# Patient Record
Sex: Female | Born: 1970 | Race: Black or African American | Hispanic: No | Smoking: Never smoker
Health system: Southern US, Community
[De-identification: ages and names within clinical notes are randomized; demographics above are authoritative.]

## PROBLEM LIST (undated history)

## (undated) DIAGNOSIS — IMO0002 Reserved for concepts with insufficient information to code with codable children: Secondary | ICD-10-CM

## (undated) DIAGNOSIS — Z8614 Personal history of Methicillin resistant Staphylococcus aureus infection: Secondary | ICD-10-CM

## (undated) DIAGNOSIS — R1310 Dysphagia, unspecified: Secondary | ICD-10-CM

## (undated) DIAGNOSIS — Z803 Family history of malignant neoplasm of breast: Secondary | ICD-10-CM

## (undated) DIAGNOSIS — K219 Gastro-esophageal reflux disease without esophagitis: Secondary | ICD-10-CM

## (undated) DIAGNOSIS — Z8051 Family history of malignant neoplasm of kidney: Secondary | ICD-10-CM

## (undated) DIAGNOSIS — M1990 Unspecified osteoarthritis, unspecified site: Secondary | ICD-10-CM

## (undated) DIAGNOSIS — Z8041 Family history of malignant neoplasm of ovary: Secondary | ICD-10-CM

## (undated) DIAGNOSIS — D242 Benign neoplasm of left breast: Secondary | ICD-10-CM

## (undated) DIAGNOSIS — K589 Irritable bowel syndrome without diarrhea: Secondary | ICD-10-CM

## (undated) HISTORY — DX: Family history of malignant neoplasm of kidney: Z80.51

## (undated) HISTORY — PX: BUNIONECTOMY: SHX129

## (undated) HISTORY — DX: Reserved for concepts with insufficient information to code with codable children: IMO0002

## (undated) HISTORY — PX: TUBAL LIGATION: SHX77

## (undated) HISTORY — PX: TONSILLECTOMY AND ADENOIDECTOMY: SHX28

## (undated) HISTORY — DX: Family history of malignant neoplasm of ovary: Z80.41

## (undated) HISTORY — DX: Family history of malignant neoplasm of breast: Z80.3

---

## 1998-04-22 ENCOUNTER — Emergency Department (HOSPITAL_COMMUNITY): Admission: EM | Admit: 1998-04-22 | Discharge: 1998-04-22 | Payer: Self-pay | Admitting: Emergency Medicine

## 1999-04-02 ENCOUNTER — Emergency Department (HOSPITAL_COMMUNITY): Admission: EM | Admit: 1999-04-02 | Discharge: 1999-04-02 | Payer: Self-pay | Admitting: Internal Medicine

## 2002-11-04 ENCOUNTER — Emergency Department (HOSPITAL_COMMUNITY): Admission: EM | Admit: 2002-11-04 | Discharge: 2002-11-04 | Payer: Self-pay

## 2003-03-10 ENCOUNTER — Emergency Department (HOSPITAL_COMMUNITY): Admission: EM | Admit: 2003-03-10 | Discharge: 2003-03-10 | Payer: Self-pay | Admitting: Emergency Medicine

## 2003-03-15 ENCOUNTER — Emergency Department (HOSPITAL_COMMUNITY): Admission: EM | Admit: 2003-03-15 | Discharge: 2003-03-16 | Payer: Self-pay | Admitting: Emergency Medicine

## 2004-06-23 ENCOUNTER — Ambulatory Visit (HOSPITAL_COMMUNITY): Admission: RE | Admit: 2004-06-23 | Discharge: 2004-06-23 | Payer: Self-pay | Admitting: Obstetrics

## 2004-07-06 ENCOUNTER — Encounter (INDEPENDENT_AMBULATORY_CARE_PROVIDER_SITE_OTHER): Payer: Self-pay | Admitting: *Deleted

## 2004-07-06 ENCOUNTER — Ambulatory Visit (HOSPITAL_COMMUNITY): Admission: RE | Admit: 2004-07-06 | Discharge: 2004-07-06 | Payer: Self-pay | Admitting: Obstetrics

## 2004-07-06 HISTORY — PX: HYSTEROSCOPY WITH NOVASURE: SHX5574

## 2004-07-06 HISTORY — PX: DILATION AND CURETTAGE OF UTERUS: SHX78

## 2005-06-25 ENCOUNTER — Encounter: Admission: RE | Admit: 2005-06-25 | Discharge: 2005-06-25 | Payer: Self-pay | Admitting: Neurology

## 2005-06-29 ENCOUNTER — Emergency Department (HOSPITAL_COMMUNITY): Admission: EM | Admit: 2005-06-29 | Discharge: 2005-06-29 | Payer: Self-pay | Admitting: Emergency Medicine

## 2007-08-15 ENCOUNTER — Inpatient Hospital Stay (HOSPITAL_COMMUNITY): Admission: AD | Admit: 2007-08-15 | Discharge: 2007-08-15 | Payer: Self-pay | Admitting: Obstetrics

## 2007-08-26 ENCOUNTER — Encounter: Admission: RE | Admit: 2007-08-26 | Discharge: 2007-08-26 | Payer: Self-pay | Admitting: Obstetrics

## 2007-08-26 ENCOUNTER — Encounter (INDEPENDENT_AMBULATORY_CARE_PROVIDER_SITE_OTHER): Payer: Self-pay | Admitting: Diagnostic Radiology

## 2008-07-29 ENCOUNTER — Encounter (INDEPENDENT_AMBULATORY_CARE_PROVIDER_SITE_OTHER): Payer: Self-pay | Admitting: Diagnostic Radiology

## 2008-07-29 ENCOUNTER — Encounter: Admission: RE | Admit: 2008-07-29 | Discharge: 2008-07-29 | Payer: Self-pay | Admitting: Infectious Diseases

## 2008-09-30 ENCOUNTER — Ambulatory Visit: Payer: Self-pay | Admitting: Obstetrics & Gynecology

## 2008-10-01 ENCOUNTER — Encounter: Payer: Self-pay | Admitting: Obstetrics & Gynecology

## 2008-10-01 LAB — CONVERTED CEMR LAB: Trich, Wet Prep: NONE SEEN

## 2008-11-22 ENCOUNTER — Encounter: Payer: Self-pay | Admitting: Obstetrics & Gynecology

## 2008-11-22 ENCOUNTER — Ambulatory Visit: Payer: Self-pay | Admitting: Obstetrics & Gynecology

## 2008-11-22 ENCOUNTER — Ambulatory Visit (HOSPITAL_COMMUNITY): Admission: RE | Admit: 2008-11-22 | Discharge: 2008-11-22 | Payer: Self-pay | Admitting: Obstetrics & Gynecology

## 2008-11-22 HISTORY — PX: DIAGNOSTIC LAPAROSCOPY: SUR761

## 2009-06-03 ENCOUNTER — Encounter: Admission: RE | Admit: 2009-06-03 | Discharge: 2009-06-03 | Payer: Self-pay | Admitting: Obstetrics

## 2009-06-03 ENCOUNTER — Ambulatory Visit (HOSPITAL_COMMUNITY): Admission: RE | Admit: 2009-06-03 | Discharge: 2009-06-03 | Payer: Self-pay | Admitting: Obstetrics

## 2009-11-07 ENCOUNTER — Encounter: Admission: RE | Admit: 2009-11-07 | Discharge: 2009-11-07 | Payer: Self-pay | Admitting: General Surgery

## 2010-01-24 ENCOUNTER — Encounter: Admission: RE | Admit: 2010-01-24 | Discharge: 2010-01-24 | Payer: Self-pay | Admitting: General Surgery

## 2010-09-10 ENCOUNTER — Encounter: Payer: Self-pay | Admitting: Obstetrics

## 2010-09-11 ENCOUNTER — Encounter: Payer: Self-pay | Admitting: Obstetrics

## 2010-10-06 ENCOUNTER — Other Ambulatory Visit: Payer: Self-pay | Admitting: Obstetrics

## 2010-10-06 DIAGNOSIS — Z1231 Encounter for screening mammogram for malignant neoplasm of breast: Secondary | ICD-10-CM

## 2010-11-09 ENCOUNTER — Ambulatory Visit
Admission: RE | Admit: 2010-11-09 | Discharge: 2010-11-09 | Disposition: A | Discharge status: Home / Self Care | Payer: No Typology Code available for payment source | Source: Ambulatory Visit | Attending: Obstetrics | Admitting: Obstetrics

## 2010-11-09 DIAGNOSIS — Z1231 Encounter for screening mammogram for malignant neoplasm of breast: Secondary | ICD-10-CM

## 2010-11-29 LAB — CBC
HCT: 41.2 % (ref 36.0–46.0)
Hemoglobin: 13.6 g/dL (ref 12.0–15.0)
MCHC: 33.1 g/dL (ref 30.0–36.0)
MCV: 86.2 fL (ref 78.0–100.0)
Platelets: 259 10*3/uL (ref 150–400)
RBC: 4.78 MIL/uL (ref 3.87–5.11)
RDW: 13.3 % (ref 11.5–15.5)
WBC: 4.3 10*3/uL (ref 4.0–10.5)

## 2010-11-29 LAB — PREGNANCY, URINE: Preg Test, Ur: NEGATIVE

## 2011-01-02 NOTE — Op Note (Signed)
NAME:  Wendy [name], Wendy [name]               ACCOUNT NO.:  [id number]   MEDICAL RECORD NO.:  [id number]          PATIENT TYPE:  AMB   LOCATION:  SDC                           FACILITY:  [facility]   PHYSICIAN:  Allie Bossier, MD        DATE OF BIRTH:  1971/03/15   DATE OF PROCEDURE:  DATE OF DISCHARGE:                               OPERATIVE REPORT   PREOPERATIVE DIAGNOSIS:  Chronic pelvic pain.   POSTOPERATIVE DIAGNOSIS:  Chronic pelvic pain.   PROCEDURE:  Diagnostic laparoscopy with biopsy of bilateral uterosacral  ligaments and lysis of adhesions.   SURGEON:  Allie Bossier, MD.   ANESTHESIA:  LMA.   ANESTHESIA:  Belva Agee, M.D.   COMPLICATIONS:  None.   ESTIMATED BLOOD LOSS:  Minimal.   SPECIMENS:  Biopsy from each uterosacral ligament.   INDICATIONS FOR PROCEDURE:  The patient is a 40 year old lady who has  had chronic pelvic pain since 2006.  She had this prior to an  endometrial ablation and a tubal ligation.  Her pain has continued  unabated to a daily pain, and it is unrelieved by Tylenol.  Questions  were answered.  Consents were signed, and she was taken to the operating  room.   DESCRIPTION OF PROCEDURE:  General anesthesia was given without  complications.  She was placed in the dorsal lithotomy position.  Her  abdomen and vagina were prepped and draped in the usual sterile fashion.  Her bladder was emptied with a  Robinson catheter.  Please note that a  preop bimanual exam revealed a normal size and shape mobile uterus and  non-enlarged adnexa.  A speculum was placed.  Hulka manipulator was  placed.  Gloves were changed.  Attention was turned to the abdomen.  A  vertical umbilical incision was made.  A Veress needle was placed  intraperitoneally.  Low-flow CO2 was used to insufflate the abdomen,  almost 3 liters.  After good pneumoperitoneum was established a 10-mm  trocar was placed.  Laparoscopy confirmed good placement.  She was  placed in Trendelenburg position.  Her  pelvis was inspected in an alpha  manner, beginning with the right adnexa.  The oviducts both showed  evidence of previous tubal ligation done in 2006.  The cul-de-sac had a  minimal amount of clear fluid.  There was a cherry-type lesion on the  right uterosacral ligament and a white scarring type, possible  endometriosis lesion on the left uterosacral ligament.  The left ovary  was normal.  There was a paratubal cyst dangling from the end of the  left oviduct, and it was adherent to the left ovarian fossa.  This was  taken down bluntly with no blood loss.  The anterior cul-de-sac appeared  entirely normal.  The upper abdomen appeared normal as well.  I observed  the position of the ureters and then carefully biopsied the peritoneum  from both uterosacral ligaments lesions.  No bleeding was noted at the  sites.  The specimens were sent to Pathology.  Please note that after  placing a 10-mm port, I placed  a 5-mm port in each lower quadrant under  direct laparoscopic visualization, taking care to avoid the inferior  epigastric vessels.  At the completion of the case, the 5-mm ports were  both removed.  No bleeding was noted from their sites.  The CO2 was  allowed to escape from the abdomen, and the 10-mm port was removed.  Her  fascia was elevated with Allis clamps at the umbilicus and closed with  figure-of-eight 0 Vicryl suture.  A total of 10 mL of 25% Marcaine was  injected in the subcutaneous tissue, divided among the three incisions.  Subcuticular closure was done with 4-0 Vicryl suture.  Instrument and  sponge counts were correct.  The Hulka manipulator was removed.  She was  extubated and taken to the recovery room in stable condition.      Allie Bossier, MD  Electronically Signed     MCD/MEDQ  D:  11/22/2008  T:  11/22/2008  Job:  [id number]

## 2011-01-05 NOTE — Op Note (Signed)
NAME:  [name], Wendy              ACCOUNT NO.:  [id number]   MEDICAL RECORD NO.:  [id number]          PATIENT TYPE:  AMB   LOCATION:  SDC                           FACILITY:  [facility]   PHYSICIAN:  Kathreen Cosier, M.D.DATE OF BIRTH:  26-Mar-1971   DATE OF PROCEDURE:  07/06/2004  DATE OF DISCHARGE:                                 OPERATIVE REPORT   PREOPERATIVE DIAGNOSES:  Multiparity desires sterilization, dysmenorrhea and  hypermenorrhea.   POSTOPERATIVE DIAGNOSES:  Multiparity desires sterilization, dysmenorrhea  and hypermenorrhea.   PROCEDURE:  D&C hysteroscopy and Novasure ablation.   DESCRIPTION OF PROCEDURE:  Under general anesthesia, the patient in  lithotomy position, abdomen, perineum and vagina prepped and draped, bladder  emptied with a straight catheter.  A weighted speculum placed in the vagina  and the anterior lip of the cervix grasped with a tenaculum.  In the  umbilicus, a transverse incision made, carried down to the fascia, the  fascia cleaned, grasped with two Kocher's in the fascia and the peritoneum  opened with the Mayo scissors. The sleeve of the trocar inserted  intraperitoneally.  Three liters of carbon dioxide infused  intraperitoneally.  __________ the scope inserted. The uterus, tubes and  ovaries normal.  Cautery probe inserted.  The right tube was cauterized 1 cm  from the cornua, cauterized in a total of four places moving lateral from  the __________.  The procedure was done in a similar fashion on the other  side. CO2 allowed to escape from the peritoneal cavity. The fascia closed  with one  stitch of #0 Dexon and the skin closed with subcuticular stitch of  4-0 Monocryl in the vagina.  The anterior lip of the cervix grasped with a  tenaculum, the endocervix was curetted, a small amount of tissue obtained.  The endometrial cavity sounded to 8 cm.  The cervical length was measured at  4 cm making the cavity length 4 cm.  The cervix dilated to #27  Shawnie Pons. The  hysteroscope inserted and the cavity was normal.  This was removed.  Sharp  curettage was then performed. A moderate amount of tissue obtained. The  Novasure device was inserted and cavity integrity was tested and then  Novasure ablation was performed for two minutes at 88 watts.  The cavity  width was 4 cm.  After the ablation, hysteroscopy was then performed again  and the cavity was noted to be totally ablated.  The fluid deficit was 40  mL.  The patient tolerated the procedure well, returned to recovery room in  good condition.      BAM/MEDQ  D:  07/06/2004  T:  07/06/2004  Job:  [id number]

## 2011-01-31 ENCOUNTER — Telehealth: Payer: Self-pay | Admitting: Internal Medicine

## 2011-01-31 NOTE — Telephone Encounter (Signed)
Spoke with patient and advised her that Dr Juanda Chance would really like to have all of her records from Dr Elnoria Howard before seeing her in the office just to insure that we do not perform the same tests etc that she has recently had completed. Patient verbalizes understanding and states that she will get records to [place].

## 2011-02-22 ENCOUNTER — Ambulatory Visit: Payer: No Typology Code available for payment source | Admitting: Internal Medicine

## 2011-03-19 ENCOUNTER — Ambulatory Visit: Payer: No Typology Code available for payment source | Admitting: Internal Medicine

## 2011-08-02 ENCOUNTER — Other Ambulatory Visit: Payer: Self-pay | Admitting: Radiology

## 2011-08-16 ENCOUNTER — Encounter (INDEPENDENT_AMBULATORY_CARE_PROVIDER_SITE_OTHER): Payer: Self-pay | Admitting: Surgery

## 2011-08-20 ENCOUNTER — Encounter (HOSPITAL_COMMUNITY): Payer: Self-pay | Admitting: Pharmacy Technician

## 2011-08-20 ENCOUNTER — Ambulatory Visit (INDEPENDENT_AMBULATORY_CARE_PROVIDER_SITE_OTHER): Payer: No Typology Code available for payment source | Admitting: Surgery

## 2011-08-20 ENCOUNTER — Encounter (INDEPENDENT_AMBULATORY_CARE_PROVIDER_SITE_OTHER): Payer: Self-pay | Admitting: Surgery

## 2011-08-20 VITALS — BP 118/88 | HR 76 | Temp 96.9°F | Resp 12 | Ht 65.5 in | Wt 137.2 lb

## 2011-08-20 DIAGNOSIS — D249 Benign neoplasm of unspecified breast: Secondary | ICD-10-CM | POA: Insufficient documentation

## 2011-08-20 NOTE — Progress Notes (Signed)
Patient ID: Wendy [name], female   DOB: 07-19-1971, 40 y.o.   MRN: [id number]  Chief Complaint  Patient presents with  . Other    new pt- eval breast mass on lt breast    HPI Wendy [name] is a 40 y.o. female. Left breast mass  HPIThis is a very pleasant female who has been seen her office and has been operated on by Dr. Bertram Savin for benign breast disease. She now has a recurrent mass in her left breast. She has had x-ray examination as well as stereotactic biopsy confirming fibroadenoma. She denies nipple discharge  Past Medical History  Diagnosis Date  . Breast cyst     right  . Degenerative disc disease   . Abnormal finding on Pap smear   . Migraine     Past Surgical History  Procedure Date  . Laparoscopy     biopsy of bilateral uterosacral ligaments and lysis of adhesions  . Bunionectomy   . Breast surgery 06/29/2009    fibroadenoma axilla    Family History  Problem Relation Age of Onset  . Cancer Mother     kidney  . Cancer Maternal Aunt     breast, lung  . Cancer Maternal Uncle     lung, stomach  . Cancer Maternal Grandmother     ovarian    Social History History  Substance Use Topics  . Smoking status: Current Some Day Smoker -- 24 years    Types: Cigars  . Smokeless tobacco: Not on file  . Alcohol Use: Yes     occasional    Allergies  Allergen Reactions  . Morphine And Related     No current outpatient prescriptions on file.    Review of Systems Review of Systems  Constitutional: Negative for fever, chills and unexpected weight change.  HENT: Negative for hearing loss, congestion, sore throat, trouble swallowing and voice change.   Eyes: Negative for visual disturbance.  Respiratory: Negative for cough and wheezing.   Cardiovascular: Negative for chest pain, palpitations and leg swelling.  Gastrointestinal: Negative for nausea, vomiting, abdominal pain, diarrhea, constipation, blood in stool, abdominal distention and anal bleeding.    Genitourinary: Negative for hematuria, vaginal bleeding and difficulty urinating.  Musculoskeletal: Negative for arthralgias.  Skin: Negative for rash and wound.  Neurological: Negative for seizures, syncope and headaches.  Hematological: Negative for adenopathy. Does not bruise/bleed easily.  Psychiatric/Behavioral: Negative for confusion.    Blood pressure 118/88, pulse 76, temperature 96.9 F (36.1 C), temperature source Temporal, resp. rate 12, height 5' 5.5" (1.664 m), weight 137 lb 3.2 oz (62.234 kg).  Physical Exam Physical Exam  Constitutional: She is oriented to person, place, and time. She appears well-developed and well-nourished. No distress.  HENT:  Head: Normocephalic and atraumatic.  Right Ear: External ear normal.  Left Ear: External ear normal.  Nose: Nose normal.  Mouth/Throat: Oropharynx is clear and moist.  Eyes: Conjunctivae are normal. Pupils are equal, round, and reactive to light. No scleral icterus.  Neck: Normal range of motion. Neck supple. No tracheal deviation present.  Cardiovascular: Normal rate, regular rhythm and intact distal pulses.   No murmur heard. Pulmonary/Chest: Effort normal and breath sounds normal. No respiratory distress. She has no wheezes.  Musculoskeletal: Normal range of motion. She exhibits no edema and no tenderness.  Lymphadenopathy:    She has no cervical adenopathy.  Neurological: She is alert and oriented to person, place, and time.  Skin: Skin is warm and dry. No erythema.  Psychiatric: Her behavior is normal. Judgment normal.  Breasts are examined bilaterally. She has a 2 cm firm mobile mass in the left breast in the upper outer quadrant near the axilla  Data Reviewed I have reviewed the patient's mammogram and ultrasound as well as biopsy results confirming the left breast fibroadenoma  Assessment    Left breast fibroadenoma    Plan    Given her family history and her concerns, I discussed conservative measures for  surgical excision. I would agree with surgical excision for histologic complete evaluation. I discussed the risks of surgery with her which includes but not limited to bleeding, infection, injury, need for further surgery, etc. She understands and wishes to proceed. Likely the success is good.       Wendy [name] A 08/20/2011, 10:49 AM

## 2011-08-21 MED ORDER — CEFAZOLIN SODIUM 1-5 GM-% IV SOLN
1.0000 g | INTRAVENOUS | Status: AC
  Administered 2011-08-22: 1 g via INTRAVENOUS
  Filled 2011-08-21: qty 50

## 2011-08-22 ENCOUNTER — Ambulatory Visit (HOSPITAL_COMMUNITY): Payer: No Typology Code available for payment source

## 2011-08-22 ENCOUNTER — Encounter (HOSPITAL_COMMUNITY): Payer: Self-pay

## 2011-08-22 ENCOUNTER — Ambulatory Visit (HOSPITAL_COMMUNITY)
Admission: RE | Admit: 2011-08-22 | Discharge: 2011-08-22 | Disposition: A | Discharge status: Home / Self Care | Payer: No Typology Code available for payment source | Source: Ambulatory Visit | Attending: Surgery | Admitting: Surgery

## 2011-08-22 ENCOUNTER — Other Ambulatory Visit (INDEPENDENT_AMBULATORY_CARE_PROVIDER_SITE_OTHER): Payer: Self-pay | Admitting: Surgery

## 2011-08-22 ENCOUNTER — Encounter (HOSPITAL_COMMUNITY)
Admission: RE | Disposition: A | Discharge status: Home / Self Care | Payer: Self-pay | Source: Ambulatory Visit | Attending: Surgery

## 2011-08-22 DIAGNOSIS — D249 Benign neoplasm of unspecified breast: Secondary | ICD-10-CM | POA: Insufficient documentation

## 2011-08-22 HISTORY — PX: BREAST BIOPSY: SHX20

## 2011-08-22 LAB — CBC
HCT: 36.4 % (ref 36.0–46.0)
MCHC: 34.1 g/dL (ref 30.0–36.0)
Platelets: 226 10*3/uL (ref 150–400)
RDW: 13.2 % (ref 11.5–15.5)
WBC: 4.5 10*3/uL (ref 4.0–10.5)

## 2011-08-22 LAB — SURGICAL PCR SCREEN: Staphylococcus aureus: NEGATIVE

## 2011-08-22 SURGERY — BREAST BIOPSY
Anesthesia: General | Site: Breast | Laterality: Left | Wound class: Clean

## 2011-08-22 MED ORDER — 0.9 % SODIUM CHLORIDE (POUR BTL) OPTIME
TOPICAL | Status: DC | PRN
  Administered 2011-08-22: 1000 mL

## 2011-08-22 MED ORDER — PROMETHAZINE HCL 25 MG/ML IJ SOLN: 12.5000 mg | Freq: Four times a day (QID) | INTRAMUSCULAR | Status: DC | PRN

## 2011-08-22 MED ORDER — ONDANSETRON HCL 4 MG/2ML IJ SOLN
INTRAMUSCULAR | Status: DC | PRN
  Administered 2011-08-22: 4 mg via INTRAVENOUS

## 2011-08-22 MED ORDER — KETOROLAC TROMETHAMINE 30 MG/ML IJ SOLN
INTRAMUSCULAR | Status: DC | PRN
  Administered 2011-08-22: 30 mg via INTRAVENOUS

## 2011-08-22 MED ORDER — SODIUM CHLORIDE 0.9 % IJ SOLN: 3.0000 mL | Freq: Two times a day (BID) | INTRAMUSCULAR | Status: DC

## 2011-08-22 MED ORDER — FENTANYL CITRATE 0.05 MG/ML IJ SOLN
INTRAMUSCULAR | Status: DC | PRN
  Administered 2011-08-22: 25 ug via INTRAVENOUS
  Administered 2011-08-22: 100 ug via INTRAVENOUS

## 2011-08-22 MED ORDER — LACTATED RINGERS IV SOLN
INTRAVENOUS | Status: DC | PRN
  Administered 2011-08-22: 11:00:00 via INTRAVENOUS

## 2011-08-22 MED ORDER — MUPIROCIN 2 % EX OINT
TOPICAL_OINTMENT | Freq: Two times a day (BID) | CUTANEOUS | Status: DC
  Filled 2011-08-22: qty 22

## 2011-08-22 MED ORDER — MIDAZOLAM HCL 5 MG/5ML IJ SOLN
INTRAMUSCULAR | Status: DC | PRN
  Administered 2011-08-22: 2 mg via INTRAVENOUS

## 2011-08-22 MED ORDER — SODIUM CHLORIDE 0.9 % IV SOLN: 250.0000 mL | INTRAVENOUS | Status: DC | PRN

## 2011-08-22 MED ORDER — PROPOFOL 10 MG/ML IV EMUL
INTRAVENOUS | Status: DC | PRN
  Administered 2011-08-22: 200 mg via INTRAVENOUS

## 2011-08-22 MED ORDER — HYDROCODONE-ACETAMINOPHEN 5-325 MG PO TABS: 1.0000 | ORAL_TABLET | Freq: Four times a day (QID) | ORAL | Status: AC | PRN

## 2011-08-22 MED ORDER — OXYCODONE HCL 5 MG PO TABS: 5.0000 mg | ORAL_TABLET | ORAL | Status: DC | PRN

## 2011-08-22 MED ORDER — ONDANSETRON HCL 4 MG/2ML IJ SOLN: 4.0000 mg | Freq: Four times a day (QID) | INTRAMUSCULAR | Status: DC | PRN

## 2011-08-22 MED ORDER — ACETAMINOPHEN 325 MG PO TABS: 650.0000 mg | ORAL_TABLET | ORAL | Status: DC | PRN

## 2011-08-22 MED ORDER — HYDROMORPHONE HCL PF 1 MG/ML IJ SOLN: 0.2500 mg | INTRAMUSCULAR | Status: DC | PRN

## 2011-08-22 MED ORDER — LACTATED RINGERS IV SOLN
INTRAVENOUS | Status: DC
  Administered 2011-08-22: 12:00:00 via INTRAVENOUS

## 2011-08-22 MED ORDER — BUPIVACAINE-EPINEPHRINE 0.25% -1:200000 IJ SOLN
INTRAMUSCULAR | Status: DC | PRN
  Administered 2011-08-22: 10 mL

## 2011-08-22 MED ORDER — FENTANYL CITRATE 0.05 MG/ML IJ SOLN: 25.0000 ug | INTRAMUSCULAR | Status: DC | PRN

## 2011-08-22 MED ORDER — SODIUM CHLORIDE 0.9 % IV SOLN: 0.1000 mg/kg | Freq: Once | INTRAVENOUS | Status: DC | PRN

## 2011-08-22 MED ORDER — ACETAMINOPHEN 650 MG RE SUPP: 650.0000 mg | RECTAL | Status: DC | PRN

## 2011-08-22 MED ORDER — SODIUM CHLORIDE 0.9 % IJ SOLN: 3.0000 mL | INTRAMUSCULAR | Status: DC | PRN

## 2011-08-22 MED ORDER — MUPIROCIN 2 % EX OINT
TOPICAL_OINTMENT | CUTANEOUS | Status: AC
  Administered 2011-08-22: 1 via NASAL
  Filled 2011-08-22: qty 22

## 2011-08-22 SURGICAL SUPPLY — 50 items
BENZOIN TINCTURE PRP APPL 2/3 (GAUZE/BANDAGES/DRESSINGS) IMPLANT
BINDER BREAST LRG (GAUZE/BANDAGES/DRESSINGS) IMPLANT
BINDER BREAST XLRG (GAUZE/BANDAGES/DRESSINGS) IMPLANT
BLADE SURG 15 STRL LF DISP TIS (BLADE) ×1 IMPLANT
BLADE SURG 15 STRL SS (BLADE) ×1
CANISTER SUCTION 2500CC (MISCELLANEOUS) IMPLANT
CHLORAPREP W/TINT 26ML (MISCELLANEOUS) ×2 IMPLANT
CLOTH BEACON ORANGE TIMEOUT ST (SAFETY) ×2 IMPLANT
CONT SPEC 4OZ CLIKSEAL STRL BL (MISCELLANEOUS) ×2 IMPLANT
COVER SURGICAL LIGHT HANDLE (MISCELLANEOUS) ×2 IMPLANT
DECANTER SPIKE VIAL GLASS SM (MISCELLANEOUS) IMPLANT
DEVICE DUBIN SPECIMEN MAMMOGRA (MISCELLANEOUS) IMPLANT
DRAPE PED LAPAROTOMY (DRAPES) ×2 IMPLANT
DRSG TEGADERM 2-3/8X2-3/4 SM (GAUZE/BANDAGES/DRESSINGS) ×2 IMPLANT
DRSG TEGADERM 4X4.75 (GAUZE/BANDAGES/DRESSINGS) IMPLANT
ELECT CAUTERY BLADE 6.4 (BLADE) ×2 IMPLANT
ELECT REM PT RETURN 9FT ADLT (ELECTROSURGICAL) ×2
ELECTRODE REM PT RTRN 9FT ADLT (ELECTROSURGICAL) ×1 IMPLANT
GAUZE SPONGE 2X2 8PLY STRL LF (GAUZE/BANDAGES/DRESSINGS) IMPLANT
GAUZE SPONGE 4X4 16PLY XRAY LF (GAUZE/BANDAGES/DRESSINGS) ×2 IMPLANT
GLOVE BIO SURGEON STRL SZ7 (GLOVE) ×2 IMPLANT
GLOVE BIOGEL PI IND STRL 7.0 (GLOVE) ×1 IMPLANT
GLOVE BIOGEL PI INDICATOR 7.0 (GLOVE) ×1
GLOVE ECLIPSE 6.5 STRL STRAW (GLOVE) ×2 IMPLANT
GLOVE SURG SIGNA 7.5 PF LTX (GLOVE) ×4 IMPLANT
GOWN PREVENTION PLUS XLARGE (GOWN DISPOSABLE) ×2 IMPLANT
GOWN STRL NON-REIN LRG LVL3 (GOWN DISPOSABLE) ×2 IMPLANT
KIT BASIN OR (CUSTOM PROCEDURE TRAY) ×2 IMPLANT
KIT ROOM TURNOVER OR (KITS) ×2 IMPLANT
NEEDLE HYPO 25GX1X1/2 BEV (NEEDLE) ×2 IMPLANT
NS IRRIG 1000ML POUR BTL (IV SOLUTION) ×2 IMPLANT
PACK SURGICAL SETUP 50X90 (CUSTOM PROCEDURE TRAY) ×2 IMPLANT
PAD ARMBOARD 7.5X6 YLW CONV (MISCELLANEOUS) ×2 IMPLANT
PENCIL BUTTON HOLSTER BLD 10FT (ELECTRODE) ×2 IMPLANT
SPECIMEN JAR MEDIUM (MISCELLANEOUS) IMPLANT
SPONGE GAUZE 2X2 STER 10/PKG (GAUZE/BANDAGES/DRESSINGS)
SPONGE GAUZE 4X4 12PLY (GAUZE/BANDAGES/DRESSINGS) ×2 IMPLANT
STRIP CLOSURE SKIN 1/2X4 (GAUZE/BANDAGES/DRESSINGS) ×2 IMPLANT
SUT MNCRL AB 4-0 PS2 18 (SUTURE) ×2 IMPLANT
SUT SILK 2 0 FS (SUTURE) ×2 IMPLANT
SUT VIC AB 3-0 SH 27 (SUTURE) ×1
SUT VIC AB 3-0 SH 27X BRD (SUTURE) ×1 IMPLANT
SYR BULB 3OZ (MISCELLANEOUS) IMPLANT
SYR CONTROL 10ML LL (SYRINGE) ×2 IMPLANT
TOWEL OR 17X24 6PK STRL BLUE (TOWEL DISPOSABLE) IMPLANT
TOWEL OR 17X26 10 PK STRL BLUE (TOWEL DISPOSABLE) ×2 IMPLANT
TOWEL OR NON WOVEN STRL DISP B (DISPOSABLE) ×2 IMPLANT
TUBE CONNECTING 12X1/4 (SUCTIONS) IMPLANT
WATER STERILE IRR 1000ML POUR (IV SOLUTION) IMPLANT
YANKAUER SUCT BULB TIP NO VENT (SUCTIONS) IMPLANT

## 2011-08-22 NOTE — Preoperative (Signed)
Beta Blockers   Reason not to administer Beta Blockers:Not Applicable 

## 2011-08-22 NOTE — Transfer of Care (Signed)
Immediate Anesthesia Transfer of Care Note  Patient: Wendy [name]  Procedure(s) Performed:  BREAST BIOPSY - excision left breast mass  Patient Location: PACU  Anesthesia Type: General  Level of Consciousness: awake, alert  and oriented  Airway & Oxygen Therapy: Patient Spontanous Breathing and Patient connected to nasal cannula oxygen  Post-op Assessment: Report given to PACU RN, Post -op Vital signs reviewed and stable and Patient moving all extremities  Post vital signs: Reviewed and stable  Complications: No apparent anesthesia complications

## 2011-08-22 NOTE — Anesthesia Postprocedure Evaluation (Signed)
  Anesthesia Post-op Note  Patient: Wendy [name]  Procedure(s) Performed:  BREAST BIOPSY - excision left breast mass  Patient Location: PACU  Anesthesia Type: General  Level of Consciousness: awake, alert  and oriented  Airway and Oxygen Therapy: Patient Spontanous Breathing  Post-op Pain: none  Post-op Assessment: Post-op Vital signs reviewed and Patient's Cardiovascular Status Stable  Post-op Vital Signs: stable  Complications: No apparent anesthesia complications

## 2011-08-22 NOTE — H&P (View-Only) (Signed)
Patient ID: Wendy [name], female   DOB: 12/07/1970, 40 y.o.   MRN: [id number]  Chief Complaint  Patient presents with  . Other    new pt- eval breast mass on lt breast    HPI Wendy [name] is a 40 y.o. female. Left breast mass  HPIThis is a very pleasant female who has been seen her office and has been operated on by Dr. Amber Allen for benign breast disease. She now has a recurrent mass in her left breast. She has had x-ray examination as well as stereotactic biopsy confirming fibroadenoma. She denies nipple discharge  Past Medical History  Diagnosis Date  . Breast cyst     right  . Degenerative disc disease   . Abnormal finding on Pap smear   . Migraine     Past Surgical History  Procedure Date  . Laparoscopy     biopsy of bilateral uterosacral ligaments and lysis of adhesions  . Bunionectomy   . Breast surgery 06/29/2009    fibroadenoma axilla    Family History  Problem Relation Age of Onset  . Cancer Mother     kidney  . Cancer Maternal Aunt     breast, lung  . Cancer Maternal Uncle     lung, stomach  . Cancer Maternal Grandmother     ovarian    Social History History  Substance Use Topics  . Smoking status: Current Some Day Smoker -- 24 years    Types: Cigars  . Smokeless tobacco: Not on file  . Alcohol Use: Yes     occasional    Allergies  Allergen Reactions  . Morphine And Related     No current outpatient prescriptions on file.    Review of Systems Review of Systems  Constitutional: Negative for fever, chills and unexpected weight change.  HENT: Negative for hearing loss, congestion, sore throat, trouble swallowing and voice change.   Eyes: Negative for visual disturbance.  Respiratory: Negative for cough and wheezing.   Cardiovascular: Negative for chest pain, palpitations and leg swelling.  Gastrointestinal: Negative for nausea, vomiting, abdominal pain, diarrhea, constipation, blood in stool, abdominal distention and anal bleeding.    Genitourinary: Negative for hematuria, vaginal bleeding and difficulty urinating.  Musculoskeletal: Negative for arthralgias.  Skin: Negative for rash and wound.  Neurological: Negative for seizures, syncope and headaches.  Hematological: Negative for adenopathy. Does not bruise/bleed easily.  Psychiatric/Behavioral: Negative for confusion.    Blood pressure 118/88, pulse 76, temperature 96.9 F (36.1 C), temperature source Temporal, resp. rate 12, height 5' 5.5" (1.664 m), weight 137 lb 3.2 oz (62.234 kg).  Physical Exam Physical Exam  Constitutional: She is oriented to person, place, and time. She appears well-developed and well-nourished. No distress.  HENT:  Head: Normocephalic and atraumatic.  Right Ear: External ear normal.  Left Ear: External ear normal.  Nose: Nose normal.  Mouth/Throat: Oropharynx is clear and moist.  Eyes: Conjunctivae are normal. Pupils are equal, round, and reactive to light. No scleral icterus.  Neck: Normal range of motion. Neck supple. No tracheal deviation present.  Cardiovascular: Normal rate, regular rhythm and intact distal pulses.   No murmur heard. Pulmonary/Chest: Effort normal and breath sounds normal. No respiratory distress. She has no wheezes.  Musculoskeletal: Normal range of motion. She exhibits no edema and no tenderness.  Lymphadenopathy:    She has no cervical adenopathy.  Neurological: She is alert and oriented to person, place, and time.  Skin: Skin is warm and dry. No erythema.    Psychiatric: Her behavior is normal. Judgment normal.  Breasts are examined bilaterally. She has a 2 cm firm mobile mass in the left breast in the upper outer quadrant near the axilla  Data Reviewed I have reviewed the patient's mammogram and ultrasound as well as biopsy results confirming the left breast fibroadenoma  Assessment    Left breast fibroadenoma    Plan    Given her family history and her concerns, I discussed conservative measures for  surgical excision. I would agree with surgical excision for histologic complete evaluation. I discussed the risks of surgery with her which includes but not limited to bleeding, infection, injury, need for further surgery, etc. She understands and wishes to proceed. Likely the success is good.       Arnell Slivinski A 08/20/2011, 10:49 AM    

## 2011-08-22 NOTE — Interval H&P Note (Signed)
History and Physical Interval Note: she has had no change in her history or her physical exam  08/22/2011 8:25 AM  Sharol Given  has presented today for surgery, with the diagnosis of left breast mass  The various methods of treatment have been discussed with the patient and family. After consideration of risks, benefits and other options for treatment, the patient has consented to  Procedure(s): BREAST BIOPSY LEFT BREAST MASS as a surgical intervention .  The patients' history has been reviewed, patient examined, no change in status, stable for surgery.  I have reviewed the patients' chart and labs.  Questions were answered to the patient's satisfaction.     Kaarin Pardy A

## 2011-08-22 NOTE — Op Note (Signed)
08/22/2011  12:13 PM  PATIENT:  Wendy [name]  41 y.o. female  PRE-OPERATIVE DIAGNOSIS:  left breast mass  POST-OPERATIVE DIAGNOSIS:  same  PROCEDURE:  Procedure(s): EXCISION OF LEFT BREAST MASS  SURGEON:  Surgeon(s): Shelly Rubenstein, MD  PHYSICIAN ASSISTANT:   ASSISTANTS: none   ANESTHESIA:   local and general  EBL:     BLOOD ADMINISTERED:none  DRAINS: none   LOCAL MEDICATIONS USED:  MARCAINE 10CC  SPECIMEN:  Excision  DISPOSITION OF SPECIMEN:  PATHOLOGY  COUNTS:  YES  TOURNIQUET:  * No tourniquets in log *  DICTATION: Reubin Milan Dictation Indications: This is a pleasant 41 year old female with previous history of fibroadenomas of the breast. She now presents with a left breast mass. The decision has been made to excise the mass.  Findings: The mass appeared consistent with a fibroadenoma. It was sent to pathology for evaluation.  Procedure: The patient was brought to the operating room and identified as the correct patient. She was placed supine on the operating room table and general anesthesia was induced. Her left breast and prepped and draped in the usual sterile fashion. I anesthetized the skin over the palpable mass in the left upper outer quadrant with Marcaine. I then made a small incision with a scalpel. I took this down to the breast tissue with the cautery. The mass was identified and found to be consistent with a fibroadenoma. I removed it in its entirety with electrocautery. The mass is then sent to pathology for evaluation. I anesthetized the wound further with Marcaine. Hemostasis was achieved with the cautery. I then closed the subcutaneous tissue with interrupted 3-0 Vicryl sutures and closed the skin with a running 4-0 Monocryl suture. Steri-Strips, gauze, and Tegaderm were applied. The patient tolerated the procedure well. All counts were correct at the end of the procedure. The patient was then extubated in the operating room and taken in a stable  condition to the recovery room. PLAN OF CARE: Discharge to home after PACU  PATIENT DISPOSITION:  PACU - hemodynamically stable.   Delay start of Pharmacological VTE agent (>24hrs) due to surgical blood loss or risk of bleeding:  {YES/NO/NOT APPLICABLE:20182

## 2011-08-22 NOTE — Anesthesia Procedure Notes (Signed)
Procedure Name: LMA Insertion Date/Time: 08/22/2011 11:49 AM Performed by: Carmela Rima Pre-anesthesia Checklist: Emergency Drugs available, Patient identified, Timeout performed, Suction available and Patient being monitored Patient Re-evaluated:Patient Re-evaluated prior to inductionOxygen Delivery Method: Circle System Utilized Preoxygenation: Pre-oxygenation with 100% oxygen Intubation Type: IV induction Ventilation: Mask ventilation without difficulty LMA: LMA inserted LMA Size: 4.0 Tube type: Oral Number of attempts: 1 Placement Confirmation: positive ETCO2,  CO2 detector and breath sounds checked- equal and bilateral Tube secured with: Tape Dental Injury: Teeth and Oropharynx as per pre-operative assessment

## 2011-08-22 NOTE — Anesthesia Preprocedure Evaluation (Addendum)
Anesthesia Evaluation  Patient identified by MRN, date of birth, ID band Patient awake    Reviewed: Allergy & Precautions, H&P , NPO status , Patient's Chart, lab work & pertinent test results, reviewed documented beta blocker date and time   Airway Mallampati: I TM Distance: >3 FB Neck ROM: full    Dental  (+) Dental Advidsory Given   Pulmonary neg pulmonary ROS,  clear to auscultation        Cardiovascular Regular Normal    Neuro/Psych    GI/Hepatic Neg liver ROS, GERD-  Controlled,  Endo/Other  Negative Endocrine ROS  Renal/GU negative Renal ROS     Musculoskeletal   Abdominal   Peds  Hematology   Anesthesia Other Findings   Reproductive/Obstetrics negative OB ROS                          Anesthesia Physical Anesthesia Plan  ASA: II  Anesthesia Plan: General   Post-op Pain Management:    Induction: Intravenous  Airway Management Planned: LMA  Additional Equipment:   Intra-op Plan:   Post-operative Plan:   Informed Consent: I have reviewed the patients History and Physical, chart, labs and discussed the procedure including the risks, benefits and alternatives for the proposed anesthesia with the patient or authorized representative who has indicated his/her understanding and acceptance.   Dental advisory given  Plan Discussed with: CRNA and Surgeon  Anesthesia Plan Comments:         Anesthesia Quick Evaluation

## 2011-08-23 ENCOUNTER — Encounter (HOSPITAL_COMMUNITY): Payer: Self-pay | Admitting: Surgery

## 2011-08-29 ENCOUNTER — Encounter (INDEPENDENT_AMBULATORY_CARE_PROVIDER_SITE_OTHER): Payer: Self-pay | Admitting: Obstetrics

## 2011-08-29 ENCOUNTER — Encounter (INDEPENDENT_AMBULATORY_CARE_PROVIDER_SITE_OTHER): Payer: Self-pay | Admitting: General Surgery

## 2011-09-12 ENCOUNTER — Ambulatory Visit (INDEPENDENT_AMBULATORY_CARE_PROVIDER_SITE_OTHER): Payer: No Typology Code available for payment source | Admitting: Surgery

## 2011-09-12 ENCOUNTER — Encounter (INDEPENDENT_AMBULATORY_CARE_PROVIDER_SITE_OTHER): Payer: Self-pay | Admitting: Surgery

## 2011-09-12 VITALS — BP 118/76 | HR 68 | Temp 97.4°F | Resp 16 | Ht 65.5 in | Wt 145.4 lb

## 2011-09-12 DIAGNOSIS — Z09 Encounter for follow-up examination after completed treatment for conditions other than malignant neoplasm: Secondary | ICD-10-CM

## 2011-09-12 NOTE — Progress Notes (Signed)
Subjective:     Patient ID: Wendy [name], female   DOB: 1971-06-16, 41 y.o.   MRN: [id number]  HPI  She is here for her first postoperative visit. She has no complaints. She is doing well Review of Systems     Objective:   Physical Exam    Her left breast incision is healing well. There is no evidence of infection.  The final pathology showed a fibroadenoma with no evidence of malignancy Assessment:     Patient status post excision of left breast fibroadenoma    Plan:     She may return to normal activity. I will see her as needed

## 2011-11-01 ENCOUNTER — Encounter (HOSPITAL_COMMUNITY): Payer: Self-pay | Admitting: *Deleted

## 2011-11-01 ENCOUNTER — Emergency Department (HOSPITAL_COMMUNITY)
Admission: EM | Admit: 2011-11-01 | Discharge: 2011-11-02 | Disposition: A | Discharge status: Home / Self Care | Payer: No Typology Code available for payment source | Attending: Emergency Medicine | Admitting: Emergency Medicine

## 2011-11-01 ENCOUNTER — Other Ambulatory Visit: Payer: Self-pay | Admitting: Family Medicine

## 2011-11-01 DIAGNOSIS — F17200 Nicotine dependence, unspecified, uncomplicated: Secondary | ICD-10-CM | POA: Insufficient documentation

## 2011-11-01 DIAGNOSIS — H109 Unspecified conjunctivitis: Secondary | ICD-10-CM | POA: Insufficient documentation

## 2011-11-01 DIAGNOSIS — H538 Other visual disturbances: Secondary | ICD-10-CM | POA: Insufficient documentation

## 2011-11-01 DIAGNOSIS — R51 Headache: Secondary | ICD-10-CM | POA: Insufficient documentation

## 2011-11-01 NOTE — ED Notes (Signed)
Pt remains in waiting room.  No distress noted.  Updated on POC and wait times. 
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
## 2011-11-02 ENCOUNTER — Encounter (HOSPITAL_COMMUNITY): Payer: Self-pay | Admitting: Emergency Medicine

## 2011-11-02 MED ORDER — CETIRIZINE HCL 10 MG PO CAPS: 10.0000 mg | ORAL_CAPSULE | Freq: Every day | ORAL | Status: DC

## 2011-11-02 MED ORDER — TETRACAINE HCL 0.5 % OP SOLN
1.0000 [drp] | Freq: Once | OPHTHALMIC | Status: AC
  Administered 2011-11-02: 1 [drp] via OPHTHALMIC
  Filled 2011-11-02: qty 2

## 2011-11-02 MED ORDER — POLYMYXIN B-TRIMETHOPRIM 10000-0.1 UNIT/ML-% OP SOLN: 1.0000 [drp] | OPHTHALMIC | Status: AC
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
## 2011-11-02 NOTE — ED Provider Notes (Signed)
History     CSN: [id number]  Arrival date & time 11/01/11  2005   First MD Initiated Contact with Patient 11/02/11 0136      Chief Complaint  Patient presents with  . Headache    (Consider location/radiation/quality/duration/timing/severity/associated sxs/prior treatment) HPI Comments: Patient complains of one day of blurry vision.  She notes that it is in bilateral eyes.  She's noted some clear discharge from both her eyes as well as mild swelling of her conjunctiva.  She is noted some mild swelling of her eyelids as well.  No fevers.  No other cold symptoms.  Patient has a associated headache and was seen by her primary care physician today and given Phenergan and Toradol for the headache out of concern that she may have a migraine.  Patient presents now because her vision has remained persistently blurry and she still is mild headache but does not want any pain medications for it at this time.  She does not wear contacts or glasses.  She has no known injuries.  Patient is a 41 y.o. female presenting with headaches and eye problem. The history is provided by the patient.  Headache  Pertinent negatives include no fever, no shortness of breath, no nausea and no vomiting.  Eye Problem  Pertinent negatives include no discharge, no eye redness, no nausea and no vomiting.    Past Medical History  Diagnosis Date  . Breast cyst     right  . Degenerative disc disease   . Abnormal finding on Pap smear   . Anxiety   . Migraine     h/o migraines- last one > one yr.     Past Surgical History  Procedure Date  . Laparoscopy     biopsy of bilateral uterosacral ligaments and lysis of adhesions  . Bunionectomy   . Breast surgery 06/29/2009    fibroadenoma axilla- R   . Tonsillectomy     as child   . Tubal ligation   . Dilation and curettage of uterus     ablation- 2005  . Breast biopsy 08/22/2011    Procedure: BREAST BIOPSY;  Surgeon: Shelly Rubenstein, MD;  Location: [facility] OR;  Service:  General;  Laterality: Left;  excision left breast mass    Family History  Problem Relation Age of Onset  . Cancer Mother     kidney  . Cancer Maternal Aunt     breast, lung  . Cancer Maternal Uncle     lung, stomach  . Cancer Maternal Grandmother     ovarian  . Anesthesia problems Neg Hx   . Hypotension Neg Hx   . Malignant hyperthermia Neg Hx   . Pseudochol deficiency Neg Hx     History  Substance Use Topics  . Smoking status: Current Some Day Smoker -- 24 years    Types: Cigars  . Smokeless tobacco: Never Used  . Alcohol Use: Yes     occasional- on weekend only    OB History    Grav Para Term Preterm Abortions TAB SAB Ect Mult Living                  Review of Systems  Constitutional: Negative.  Negative for fever and chills.  Eyes: Positive for visual disturbance. Negative for discharge and redness.  Respiratory: Negative.  Negative for cough and shortness of breath.   Cardiovascular: Negative.  Negative for chest pain.  Gastrointestinal: Negative.  Negative for nausea, vomiting, abdominal pain and diarrhea.  Genitourinary: Negative.  Negative for dysuria and vaginal discharge.  Musculoskeletal: Negative.  Negative for back pain.  Skin: Negative.  Negative for color change and rash.  Neurological: Positive for headaches. Negative for syncope.  Hematological: Negative.  Negative for adenopathy.  Psychiatric/Behavioral: Negative.  Negative for confusion.  All other systems reviewed and are negative.    Allergies  Morphine and related  Home Medications   Current Outpatient Rx  Name Route Sig Dispense Refill  . DULOXETINE HCL 30 MG PO CPEP Oral Take 30 mg by mouth daily.    Marland Kitchen ELETRIPTAN HYDROBROMIDE 40 MG PO TABS Oral One tablet by mouth as needed for migraine headache.  If the headache improves and then returns, dose may be repeated after 2 hours have elapsed since first dose (do not exceed 80 mg per day). may repeat in 2 hours if necessary    . KETOROLAC  TROMETHAMINE 30 MG/ML IM SOLN Intramuscular Inject 60 mg into the muscle once.    Marland Kitchen PROMETHAZINE HCL 50 MG/ML IJ SOLN Intramuscular Inject 50 mg into the muscle once. Once today at office    . ZOLPIDEM TARTRATE 10 MG PO TABS Oral Take 10 mg by mouth at bedtime as needed. For sleep    . CETIRIZINE HCL 10 MG PO CAPS Oral Take 1 capsule (10 mg total) by mouth daily. 10 capsule 0  . POLYMYXIN B-TRIMETHOPRIM 10000-0.1 UNIT/ML-% OP SOLN Both Eyes Place 1 drop into both eyes every 4 (four) hours. 10 mL 0    BP 111/78  Pulse 90  Temp(Src) 97.4 F (36.3 C) (Oral)  Resp 16  SpO2 98%  Physical Exam  Nursing note and vitals reviewed. Constitutional: She is oriented to person, place, and time. She appears well-developed and well-nourished.  Non-toxic appearance. She does not have a sickly appearance.  HENT:  Head: Normocephalic and atraumatic.  Eyes: EOM and lids are normal. Pupils are equal, round, and reactive to light. Right eye exhibits discharge. Left eye exhibits discharge. No scleral icterus.       Patient has bilateral mildly swollen eyelids.  She has mild conjunctival swelling laterally at both eyes.  She's clear discharge from both eyes.  Pupils are equal round reactive to light.  She has pressures of 21 in the right eye and 16 in the left eye.  Visual acuity of 20/70 in each eye and together.  On funduscopic exam I see no signs of any retinal detachment or other abnormalities.  Neck: Trachea normal and normal range of motion. Neck supple.  Cardiovascular: Normal rate, regular rhythm and normal heart sounds.   Pulmonary/Chest: Effort normal and breath sounds normal.  Abdominal: Soft. Normal appearance. There is no tenderness. There is no rebound, no guarding and no CVA tenderness.  Musculoskeletal: Normal range of motion.  Neurological: She is alert and oriented to person, place, and time. She has normal strength.  Skin: Skin is warm, dry and intact. No rash noted.  Psychiatric: She has a  normal mood and affect. Her behavior is normal. Judgment and thought content normal.    ED Course  Procedures (including critical care time)  Labs Reviewed - No data to display No results found.   1. Conjunctivitis       MDM  Patient with likely conjunctivitis given the swelling around both of her eyes with clear discharge as well.  Patient has mild decrease in vision otherwise has no signs of glaucoma his pressures in her eyes are both normal at this time.  Patient shows no signs  of funduscopic exam of other retinal problems.  It is also unlikely the patient would have bilateral vascular problems or retinal problems at the same time.  Patient understands that if her symptoms are improving she should be reevaluated and I have given her ophthalmology followup.        Nat Christen, MD 11/02/11 [phone]

## 2011-11-02 NOTE — Discharge Instructions (Signed)
Conjunctivitis Conjunctivitis is commonly called "pink eye." Conjunctivitis can be caused by bacterial or viral infection, allergies, or injuries. There is usually redness of the lining of the eye, itching, discomfort, and sometimes discharge. There may be deposits of matter along the eyelids. A viral infection usually causes a watery discharge, while a bacterial infection causes a yellowish, thick discharge. Pink eye is very contagious and spreads by direct contact. You may be given antibiotic eyedrops as part of your treatment. Before using your eye medicine, remove all drainage from the eye by washing gently with warm water and cotton balls. Continue to use the medication until you have awakened 2 mornings in a row without discharge from the eye. Do not rub your eye. This increases the irritation and helps spread infection. Use separate towels from other household members. Wash your hands with soap and water before and after touching your eyes. Use cold compresses to reduce pain and sunglasses to relieve irritation from light. Do not wear contact lenses or wear eye makeup until the infection is gone. SEEK MEDICAL CARE IF:   Your symptoms are not better after 3 days of treatment.   You have increased pain or trouble seeing.   The outer eyelids become very red or swollen.  Document Released: 09/13/2004 Document Revised: 07/26/2011 Document Reviewed: 08/06/2005 [employer] Patient Information 2012 [employer]. 

## 2011-11-02 NOTE — ED Notes (Signed)
Pt stated understanding of discharge instructions.

## 2011-11-06 ENCOUNTER — Ambulatory Visit
Admission: RE | Admit: 2011-11-06 | Discharge: 2011-11-06 | Disposition: A | Discharge status: Home / Self Care | Payer: No Typology Code available for payment source | Source: Ambulatory Visit | Attending: Family Medicine | Admitting: Family Medicine

## 2011-11-06 DIAGNOSIS — R51 Headache: Secondary | ICD-10-CM

## 2012-07-25 ENCOUNTER — Ambulatory Visit (HOSPITAL_COMMUNITY)
Admission: RE | Admit: 2012-07-25 | Discharge: 2012-07-25 | Disposition: A | Discharge status: Home / Self Care | Payer: No Typology Code available for payment source | Source: Ambulatory Visit | Attending: Obstetrics and Gynecology | Admitting: Obstetrics and Gynecology

## 2012-07-25 ENCOUNTER — Encounter (HOSPITAL_COMMUNITY): Payer: Self-pay

## 2012-07-25 ENCOUNTER — Other Ambulatory Visit: Payer: Self-pay | Admitting: Obstetrics and Gynecology

## 2012-07-25 VITALS — BP 98/62 | Temp 98.8°F | Ht 65.5 in | Wt 157.0 lb

## 2012-07-25 DIAGNOSIS — Z01419 Encounter for gynecological examination (general) (routine) without abnormal findings: Secondary | ICD-10-CM

## 2012-07-25 DIAGNOSIS — N6325 Unspecified lump in the left breast, overlapping quadrants: Secondary | ICD-10-CM | POA: Insufficient documentation

## 2012-07-25 DIAGNOSIS — N898 Other specified noninflammatory disorders of vagina: Secondary | ICD-10-CM

## 2012-07-25 NOTE — Patient Instructions (Signed)
Taught patient how to perform BSE and gave educational materials to take home. Let know next Pap smear will be due based on results of today's Pap smear and after get results from the [facility]. Patient referred to [facility] for Diagnostic mammogram and possible ultrasound. Appointment scheduled for Friday, July 25, 2012 at 1100. Patient aware of appointment and will be there. Patient verbalized understanding.

## 2012-07-25 NOTE — Progress Notes (Addendum)
Complaints of left breast lump x 6-7 months that has increased in size. Patient stated left breast was red over area of lump a couple of weeks ago but went away. Patient complained about white watery discharge with odor for a couple of weeks. Patient stated has tried OTC yeast infection medications that did not work.   Pap Smear:    Pap smear completed today. Per patient last Pap smear was in 2010 at the [facility] and abnormal. Per patient a colposcopy was completed for follow up and patient couldn't remember if followed up after that. No Pap smear results in EPIC.  Physical exam: Breasts Breasts symmetrical. Scars on both outer breasts where patient has a history of breast masses removed on both sides. No nipple retraction bilateral breasts. No nipple discharge bilateral breasts. No lymphadenopathy. Palpated small pearl sized lump in right breast at 6 o'clock under areola. Palpated two lumps left breast. The first lump is at 3 o'clock next to areola. Per patient this lump has increased in size. Palpated small pearl sized lump in left breast at 6 o'clock under areola. Patient complained of tenderness when palpated lump in left breast at 3 o'clock. Patient referred to [facility] for Diagnostic mammogram and possible ultrasound. Appointment scheduled for Friday, July 25, 2012 at 1100.     Pelvic/Bimanual   Ext Genitalia No lesions, no swelling and white watery discharge observed on external genitalia.         Vagina Vagina pink and normal texture. No lesions and white watery discharge observed in vagina. Wet prep completed.         Cervix Cervix is present. Cervix is reddish colored and friable. White watery discharge observed on cervical os.    Uterus Uterus is present and palpable. Uterus in normal position and normal size.        Adnexae Bilateral ovaries present and palpable. No tenderness on palpation.          Rectovaginal No rectal exam completed today since  patient had no rectal complaints. No skin abnormalities observed on exam.

## 2012-07-26 LAB — WET PREP, GENITAL: Yeast Wet Prep HPF POC: NONE SEEN

## 2012-07-28 MED ORDER — METRONIDAZOLE 500 MG PO TABS: 500.0000 mg | ORAL_TABLET | Freq: Two times a day (BID) | ORAL | Status: DC

## 2012-07-28 NOTE — Addendum Note (Signed)
Addended by: Catalina Antigua on: 07/28/2012 08:26 AM   Modules accepted: Orders

## 2012-07-30 ENCOUNTER — Other Ambulatory Visit: Payer: Self-pay | Admitting: *Deleted

## 2012-07-30 DIAGNOSIS — B379 Candidiasis, unspecified: Secondary | ICD-10-CM

## 2012-07-30 DIAGNOSIS — N760 Acute vaginitis: Secondary | ICD-10-CM

## 2012-07-30 MED ORDER — FLUCONAZOLE 150 MG PO TABS: 150.0000 mg | ORAL_TABLET | Freq: Once | ORAL | Status: DC

## 2012-07-30 MED ORDER — METRONIDAZOLE 500 MG PO TABS: 500.0000 mg | ORAL_TABLET | Freq: Two times a day (BID) | ORAL | Status: DC

## 2012-07-31 ENCOUNTER — Other Ambulatory Visit: Payer: Self-pay | Admitting: Radiology

## 2012-08-06 ENCOUNTER — Telehealth (HOSPITAL_COMMUNITY): Payer: Self-pay | Admitting: *Deleted

## 2012-08-06 NOTE — Telephone Encounter (Signed)
Advised patient of pap smear results and would need repeat pap in one year. Also pap showed trichomonas and would need to make sure to finish the medication Flagyl that she was taking. Patient 's sexual partner was also would need to be treated for this also. Patient voiced understanding.

## 2012-08-21 NOTE — Addendum Note (Signed)
Encounter addended by: Saintclair Halsted, RN on: 08/21/2012  4:50 PM<BR>     Documentation filed: Charges VN

## 2012-10-23 ENCOUNTER — Emergency Department (HOSPITAL_COMMUNITY): Payer: Self-pay

## 2012-10-23 ENCOUNTER — Emergency Department (HOSPITAL_COMMUNITY)
Admission: EM | Admit: 2012-10-23 | Discharge: 2012-10-23 | Disposition: A | Discharge status: Home / Self Care | Payer: Self-pay | Attending: Emergency Medicine | Admitting: Emergency Medicine

## 2012-10-23 ENCOUNTER — Encounter (HOSPITAL_COMMUNITY): Payer: Self-pay | Admitting: *Deleted

## 2012-10-23 DIAGNOSIS — K5900 Constipation, unspecified: Secondary | ICD-10-CM | POA: Insufficient documentation

## 2012-10-23 DIAGNOSIS — R142 Eructation: Secondary | ICD-10-CM | POA: Insufficient documentation

## 2012-10-23 DIAGNOSIS — Z8742 Personal history of other diseases of the female genital tract: Secondary | ICD-10-CM | POA: Insufficient documentation

## 2012-10-23 DIAGNOSIS — Z8679 Personal history of other diseases of the circulatory system: Secondary | ICD-10-CM | POA: Insufficient documentation

## 2012-10-23 DIAGNOSIS — R110 Nausea: Secondary | ICD-10-CM | POA: Insufficient documentation

## 2012-10-23 DIAGNOSIS — R109 Unspecified abdominal pain: Secondary | ICD-10-CM | POA: Insufficient documentation

## 2012-10-23 DIAGNOSIS — F411 Generalized anxiety disorder: Secondary | ICD-10-CM | POA: Insufficient documentation

## 2012-10-23 DIAGNOSIS — R339 Retention of urine, unspecified: Secondary | ICD-10-CM | POA: Insufficient documentation

## 2012-10-23 DIAGNOSIS — Z8739 Personal history of other diseases of the musculoskeletal system and connective tissue: Secondary | ICD-10-CM | POA: Insufficient documentation

## 2012-10-23 DIAGNOSIS — Z79899 Other long term (current) drug therapy: Secondary | ICD-10-CM | POA: Insufficient documentation

## 2012-10-23 DIAGNOSIS — R141 Gas pain: Secondary | ICD-10-CM | POA: Insufficient documentation

## 2012-10-23 LAB — COMPREHENSIVE METABOLIC PANEL
ALT: 24 U/L (ref 0–35)
AST: 22 U/L (ref 0–37)
CO2: 19 mEq/L (ref 19–32)
Chloride: 103 mEq/L (ref 96–112)
Creatinine, Ser: 0.94 mg/dL (ref 0.50–1.10)
GFR calc non Af Amer: 73 mL/min — ABNORMAL LOW (ref >90)
Glucose, Bld: 130 mg/dL — ABNORMAL HIGH (ref 70–99)
Total Bilirubin: 0.3 mg/dL (ref 0.3–1.2)

## 2012-10-23 LAB — PREGNANCY, URINE: Preg Test, Ur: NEGATIVE

## 2012-10-23 LAB — CBC WITH DIFFERENTIAL/PLATELET
Eosinophils Absolute: 0 10*3/uL (ref 0.0–0.7)
Eosinophils Relative: 0 % (ref 0–5)
HCT: 40.6 % (ref 36.0–46.0)
Hemoglobin: 13.5 g/dL (ref 12.0–15.0)
Lymphs Abs: 0.6 10*3/uL — ABNORMAL LOW (ref 0.7–4.0)
MCH: 27.4 pg (ref 26.0–34.0)
MCV: 82.5 fL (ref 78.0–100.0)
Monocytes Absolute: 0.3 10*3/uL (ref 0.1–1.0)
Monocytes Relative: 4 % (ref 3–12)
Neutrophils Relative %: 86 % — ABNORMAL HIGH (ref 43–77)
RBC: 4.92 MIL/uL (ref 3.87–5.11)

## 2012-10-23 LAB — URINALYSIS, MICROSCOPIC ONLY
Bilirubin Urine: NEGATIVE
Hgb urine dipstick: NEGATIVE
Protein, ur: NEGATIVE mg/dL
Urobilinogen, UA: 0.2 mg/dL (ref 0.0–1.0)

## 2012-10-23 MED ORDER — SODIUM CHLORIDE 0.9 % IV BOLUS (SEPSIS)
1000.0000 mL | Freq: Once | INTRAVENOUS | Status: AC
  Administered 2012-10-23: 1000 mL via INTRAVENOUS

## 2012-10-23 MED ORDER — ONDANSETRON HCL 4 MG/2ML IJ SOLN
4.0000 mg | Freq: Once | INTRAMUSCULAR | Status: AC
  Administered 2012-10-23: 4 mg via INTRAVENOUS
  Filled 2012-10-23: qty 2

## 2012-10-23 MED ORDER — SODIUM CHLORIDE 0.9 % IV SOLN
Freq: Once | INTRAVENOUS | Status: AC
  Administered 2012-10-23: 21:00:00 via INTRAVENOUS

## 2012-10-23 NOTE — ED Provider Notes (Addendum)
History     CSN: [id number]  Arrival date & time 10/23/12  1617   First MD Initiated Contact with Patient 10/23/12 1702      Chief Complaint  Patient presents with  . Urinary Retention  . Abdominal Pain    (Consider location/radiation/quality/duration/timing/severity/associated sxs/prior treatment) HPI Comments: Pt comes in with cc of abd pain and urinary retention. She reports that she has been having vague, sharp abd pain for the past month, off and on, but more constant since yday. The pain is in the RUQ, epigastrium and lateral. There is no specific aggravating or relieving factors  -and no association with food intake. She has some nausea, and anorexia. Last BM was y'day. She also feels bloated and distended. No UTI like sx. - however, she endorses that she hasn't urinated all day today. No hx of GU pathology. Mother did have renal cancer. She has no back pain, no cancer hx for self, no trauma.   Patient is a 42 y.o. female presenting with abdominal pain. The history is provided by the patient.  Abdominal Pain Associated symptoms: constipation and nausea   Associated symptoms: no chest pain, no cough, no diarrhea, no hematuria, no shortness of breath, no vaginal bleeding, no vaginal discharge and no vomiting     Past Medical History  Diagnosis Date  . Breast cyst     right  . Degenerative disc disease   . Abnormal finding on Pap smear   . Anxiety   . Migraine     h/o migraines- last one > one yr.     Past Surgical History  Procedure Laterality Date  . Laparoscopy      biopsy of bilateral uterosacral ligaments and lysis of adhesions  . Bunionectomy    . Breast surgery  06/29/2009    fibroadenoma axilla- R   . Tonsillectomy      as child   . Tubal ligation    . Dilation and curettage of uterus      ablation- 2005  . Breast biopsy  08/22/2011    Procedure: BREAST BIOPSY;  Surgeon: Shelly Rubenstein, MD;  Location: [facility] OR;  Service: General;  Laterality: Left;   excision left breast mass  . Colposcopy    . Uterine ablation      Family History  Problem Relation Age of Onset  . Cancer Mother     kidney  . Cancer Maternal Aunt     breast, lung  . Cancer Maternal Uncle     lung, stomach  . Cancer Maternal Grandmother     ovarian  . Anesthesia problems Neg Hx   . Hypotension Neg Hx   . Malignant hyperthermia Neg Hx   . Pseudochol deficiency Neg Hx     History  Substance Use Topics  . Smoking status: Never Smoker   . Smokeless tobacco: Never Used  . Alcohol Use: Yes     Comment: occasional- on weekend only    OB History   Grav Para Term Preterm Abortions TAB SAB Ect Mult Living   3 2 2  1  1   2       Review of Systems  Constitutional: Negative for activity change.  HENT: Negative for facial swelling and neck pain.   Respiratory: Negative for cough, shortness of breath and wheezing.   Cardiovascular: Negative for chest pain.  Gastrointestinal: Positive for nausea, abdominal pain, constipation and abdominal distention. Negative for vomiting, diarrhea and blood in stool.  Genitourinary: Positive for decreased urine volume.  Negative for hematuria, vaginal bleeding, vaginal discharge and difficulty urinating.  Skin: Negative for color change.  Neurological: Negative for speech difficulty.  Hematological: Does not bruise/bleed easily.  Psychiatric/Behavioral: Negative for confusion.    Allergies  Morphine and related  Home Medications   Current Outpatient Rx  Name  Route  Sig  Dispense  Refill  . QUEtiapine (SEROQUEL) 300 MG tablet   Oral   Take 900 mg by mouth at bedtime.         . topiramate (TOPAMAX) 25 MG tablet   Oral   Take 100 mg by mouth daily.           BP 109/81  Pulse 136  Temp(Src) 98.9 F (37.2 C) (Oral)  Resp 18  SpO2 98%  Physical Exam  Nursing note and vitals reviewed. Constitutional: She is oriented to person, place, and time. She appears well-developed.  HENT:  Head: Normocephalic and  atraumatic.  Eyes: Conjunctivae and EOM are normal. Pupils are equal, round, and reactive to light.  Neck: Normal range of motion. Neck supple.  Cardiovascular: Normal rate, regular rhythm and normal heart sounds.   Pulmonary/Chest: Effort normal and breath sounds normal. No respiratory distress.  Abdominal: Soft. Bowel sounds are normal. She exhibits distension. There is no tenderness. There is no rebound and no guarding.  Mild RUQ tenderness and right flank tenderness.   Neurological: She is alert and oriented to person, place, and time.  Skin: Skin is warm and dry.    ED Course  Procedures (including critical care time)  Labs Reviewed  TROPONIN I  CBC WITH DIFFERENTIAL  LIPASE, BLOOD  URINALYSIS, ROUTINE W REFLEX MICROSCOPIC  PREGNANCY, URINE  COMPREHENSIVE METABOLIC PANEL   No results found.   No diagnosis found.    MDM  Pt comes in with cc of right sided abd pain - that has been present for a month, but now constant and more severe. She also has urinary retention and other GI constitutionals. DDx includes: Pancreatitis Hepatobiliary pathology including cholecystitis Gastritis/PUD SBO Tumors Intra abdominal abscess Thrombosis Peritonitis Appendicitis Hernia Nephrolithiasis Pyelonephritis UTI/Cystitis Ovarian cyst TOA Ectopic pregnancy   Currently it appears to be hepatobiliary process, and that there might be some ileus associated with it. Other possibility includes urinary retention leading to bladder spasm and distention - in which case we will have to look for the etiology.  Plan is to start with basic workup, get GI labs and also a bladder scan for possible foley placement.  6:08 PM Bladder scan shows > 200 cc of urine. Will place a foley.    Derwood Kaplan, MD 10/23/12 1808  9:19 PM Foley led to 250 cc of urine output. All her labs are WNL, and so is the [place]. Abd pain has resolved  Derwood Kaplan, MD 10/23/12 2119  Derwood Kaplan,  MD 10/23/12 2119  9:51 PM Spoke with Dr. Julien Girt - He wants [place] to remove the foley and have patient f/u with Urology if she is retaining urine. Again, neuro exam is fine, she has no back pain, she has no cancer hx, she is not on any meds. Tachycardia has improved. Abd pain has resolved. She has no chest pain, dib, no risk factors for DVT. PE - i dont think we need to work her up for PE. She has been as ked to see her PCP.  Derwood Kaplan, MD 10/23/12 1610  Derwood Kaplan, MD 10/23/12 2206

## 2012-10-23 NOTE — ED Notes (Signed)
Pt reports RUQ abd pain since 11pm last night. Pain intermittent x1.37months. Emesis x5. Also sts she has not been able to urinate since 8pm last night. Feels as if she can, but cannot void. Also c/o HA since last night.

## 2013-01-07 ENCOUNTER — Telehealth (HOSPITAL_COMMUNITY): Payer: Self-pay | Admitting: *Deleted

## 2013-01-07 NOTE — Telephone Encounter (Signed)
Patient returned call. Advised patient was time for 6 month follow up with [facility]. Patient will call and schedule appointment for June.

## 2013-01-07 NOTE — Telephone Encounter (Signed)
Telephoned patient at home # and number is invalid. Telephoned mobile number and left message to return call to [facility]. Time for patient to schedule 6 month follow up with [facility] in June.

## 2013-03-06 ENCOUNTER — Encounter: Payer: Self-pay | Admitting: Diagnostic Neuroimaging

## 2013-03-06 ENCOUNTER — Ambulatory Visit (INDEPENDENT_AMBULATORY_CARE_PROVIDER_SITE_OTHER): Payer: BC Managed Care – PPO | Admitting: Diagnostic Neuroimaging

## 2013-03-06 VITALS — BP 116/83 | HR 73 | Temp 98.2°F | Ht 66.0 in | Wt 164.5 lb

## 2013-03-06 DIAGNOSIS — M62838 Other muscle spasm: Secondary | ICD-10-CM

## 2013-03-06 DIAGNOSIS — R413 Other amnesia: Secondary | ICD-10-CM
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
## 2013-03-06 NOTE — Patient Instructions (Signed)
I will check MRI scans.  Discuss with Dr. Scharlene Gloss about gradually reducing seroquel and topiramate.

## 2013-03-12 ENCOUNTER — Ambulatory Visit (INDEPENDENT_AMBULATORY_CARE_PROVIDER_SITE_OTHER): Payer: BC Managed Care – PPO

## 2013-03-12 DIAGNOSIS — R413 Other amnesia: Secondary | ICD-10-CM

## 2013-03-12 DIAGNOSIS — M62838 Other muscle spasm: Secondary | ICD-10-CM

## 2013-03-16 MED ORDER — GADOPENTETATE DIMEGLUMINE 469.01 MG/ML IV SOLN: 15.0000 mL | Freq: Once | INTRAVENOUS | Status: AC | PRN

## 2013-03-23 ENCOUNTER — Telehealth: Payer: Self-pay | Admitting: *Deleted

## 2013-03-23 NOTE — Telephone Encounter (Signed)
I called pt and relayed the results of MRI brain and c spine to her.  She verbalized understanding.

## 2014-01-28 ENCOUNTER — Emergency Department (HOSPITAL_COMMUNITY)
Admission: EM | Admit: 2014-01-28 | Discharge: 2014-01-28 | Disposition: A | Discharge status: Home / Self Care | Payer: No Typology Code available for payment source | Source: Home / Self Care | Attending: Family Medicine | Admitting: Family Medicine

## 2014-01-28 ENCOUNTER — Encounter (HOSPITAL_COMMUNITY): Payer: Self-pay | Admitting: Emergency Medicine

## 2014-01-28 DIAGNOSIS — L02412 Cutaneous abscess of left axilla: Secondary | ICD-10-CM

## 2014-01-28 DIAGNOSIS — IMO0002 Reserved for concepts with insufficient information to code with codable children: Secondary | ICD-10-CM

## 2014-01-28 DIAGNOSIS — L03112 Cellulitis of left axilla: Secondary | ICD-10-CM

## 2014-01-28 MED ORDER — DOXYCYCLINE HYCLATE 100 MG PO CAPS: 100.0000 mg | ORAL_CAPSULE | Freq: Two times a day (BID) | ORAL | Status: DC

## 2014-01-28 NOTE — ED Notes (Signed)
C/o sore place under her left arm x 1 week, getting worse in spite of using warm compresses

## 2014-01-28 NOTE — Discharge Instructions (Signed)
Abscess An abscess is an infected area that contains a collection of pus and debris.It can occur in almost any part of the body. An abscess is also known as a furuncle or boil. CAUSES  An abscess occurs when tissue gets infected. This can occur from blockage of oil or sweat glands, infection of hair follicles, or a minor injury to the skin. As the body tries to fight the infection, pus collects in the area and creates pressure under the skin. This pressure causes pain. People with weakened immune systems have difficulty fighting infections and get certain abscesses more often.  SYMPTOMS Usually an abscess develops on the skin and becomes a painful mass that is red, warm, and tender. If the abscess forms under the skin, you may feel a moveable soft area under the skin. Some abscesses break open (rupture) on their own, but most will continue to get worse without care. The infection can spread deeper into the body and eventually into the bloodstream, causing you to feel ill.  DIAGNOSIS  Your caregiver will take your medical history and perform a physical exam. A sample of fluid may also be taken from the abscess to determine what is causing your infection. TREATMENT  Your caregiver may prescribe antibiotic medicines to fight the infection. However, taking antibiotics alone usually does not cure an abscess. Your caregiver may need to make a small cut (incision) in the abscess to drain the pus. In some cases, gauze is packed into the abscess to reduce pain and to continue draining the area. HOME CARE INSTRUCTIONS   Only take over-the-counter or prescription medicines for pain, discomfort, or fever as directed by your caregiver.  If you were prescribed antibiotics, take them as directed. Finish them even if you start to feel better.  If gauze is used, follow your caregiver's directions for changing the gauze.  To avoid spreading the infection:  Keep your draining abscess covered with a  bandage.  Wash your hands well.  Do not share personal care items, towels, or whirlpools with others.  Avoid skin contact with others.  Keep your skin and clothes clean around the abscess.  Keep all follow-up appointments as directed by your caregiver. SEEK MEDICAL CARE IF:   You have increased pain, swelling, redness, fluid drainage, or bleeding.  You have muscle aches, chills, or a general ill feeling.  You have a fever. MAKE SURE YOU:   Understand these instructions.  Will watch your condition.  Will get help right away if you are not doing well or get worse. Document Released: 05/16/2005 Document Revised: 02/05/2012 Document Reviewed: 10/19/2011 [facility] Patient Information 2014 [place].  Cellulitis Cellulitis is an infection of the skin and the tissue beneath it. The infected area is usually red and tender. Cellulitis occurs most often in the arms and lower legs.  CAUSES  Cellulitis is caused by bacteria that enter the skin through cracks or cuts in the skin. The most common types of bacteria that cause cellulitis are Staphylococcus and Streptococcus. SYMPTOMS   Redness and warmth.  Swelling.  Tenderness or pain.  Fever. DIAGNOSIS  Your caregiver can usually determine what is wrong based on a physical exam. Blood tests may also be done. TREATMENT  Treatment usually involves taking an antibiotic medicine. HOME CARE INSTRUCTIONS   Take your antibiotics as directed. Finish them even if you start to feel better.  Keep the infected arm or leg elevated to reduce swelling.  Apply a warm cloth to the affected area up to  4 times per day to relieve pain.  Only take over-the-counter or prescription medicines for pain, discomfort, or fever as directed by your caregiver.  Keep all follow-up appointments as directed by your caregiver. SEEK MEDICAL CARE IF:   You notice red streaks coming from the infected area.  Your red area gets larger or turns dark in  color.  Your bone or joint underneath the infected area becomes painful after the skin has healed.  Your infection returns in the same area or another area.  You notice a swollen bump in the infected area.  You develop new symptoms. SEEK IMMEDIATE MEDICAL CARE IF:   You have a fever.  You feel very sleepy.  You develop vomiting or diarrhea.  You have a general ill feeling (malaise) with muscle aches and pains. MAKE SURE YOU:   Understand these instructions.  Will watch your condition.  Will get help right away if you are not doing well or get worse. Document Released: 05/16/2005 Document Revised: 02/05/2012 Document Reviewed: 10/22/2011 [facility] Patient Information 2014 [place].

## 2014-01-28 NOTE — ED Provider Notes (Signed)
CSN: [id number]     Arrival date & time 01/28/14  1420 History   First MD Initiated Contact with Patient 01/28/14 1443     Chief Complaint  Patient presents with  . Skin Problem   (Consider location/radiation/quality/duration/timing/severity/associated sxs/prior Treatment) HPI Comments: Patient reports 1 week of skin infection at left axilla following dry shaving area with razor. Has tried warm compresses, however, areas continue to become larger and more tender. Denies fever/chills.  No hx of DM  The history is provided by the patient.    Past Medical History  Diagnosis Date  . Breast cyst     right  . Degenerative disc disease   . Abnormal finding on Pap smear   . Anxiety   . Migraine     h/o migraines- last one > one yr.    Past Surgical History  Procedure Laterality Date  . Laparoscopy      biopsy of bilateral uterosacral ligaments and lysis of adhesions  . Bunionectomy    . Breast surgery  06/29/2009    fibroadenoma axilla- R   . Tonsillectomy      as child   . Tubal ligation    . Dilation and curettage of uterus      ablation- 2005  . Breast biopsy  08/22/2011    Procedure: BREAST BIOPSY;  Surgeon: Harl Bowie, MD;  Location: [facility];  Service: General;  Laterality: Left;  excision left breast mass  . Colposcopy    . Uterine ablation     Family History  Problem Relation Age of Onset  . Cancer Mother     kidney  . Cancer Maternal Aunt     breast, lung  . Cancer Maternal Uncle     lung, stomach  . Cancer Maternal Grandmother     ovarian  . Anesthesia problems Neg Hx   . Hypotension Neg Hx   . Malignant hyperthermia Neg Hx   . Pseudochol deficiency Neg Hx    History  Substance Use Topics  . Smoking status: Never Smoker   . Smokeless tobacco: Never Used  . Alcohol Use: Yes     Comment: occasional- wine once a month   OB History   Grav Para Term Preterm Abortions TAB SAB Ect Mult Living   3 2 2  1  1   2      Review of Systems  All other  systems reviewed and are negative.   Allergies  Morphine and related  Home Medications   Prior to Admission medications   Medication Sig Start Date End Date Taking? Authorizing Provider  doxazosin (CARDURA) 1 MG tablet Take 1 mg by mouth at bedtime.    Historical Provider, MD  doxycycline (VIBRAMYCIN) 100 MG capsule Take 1 capsule (100 mg total) by mouth 2 (two) times daily. 01/28/14   Lahoma Rocker, PA  QUEtiapine (SEROQUEL) 300 MG tablet Take 900 mg by mouth at bedtime.    Historical Provider, MD  topiramate (TOPAMAX) 25 MG tablet Take 100 mg by mouth daily.    Historical Provider, MD   BP 134/89  Pulse 82  Temp(Src) 98.3 F (36.8 C) (Oral)  Resp 16  SpO2 99% Physical Exam  Nursing note and vitals reviewed. Constitutional: She is oriented to person, place, and time. She appears well-developed and well-nourished. No distress.  +afebrile  HENT:  Head: Normocephalic and atraumatic.  Eyes: Conjunctivae are normal.  Cardiovascular: Normal rate.   Pulmonary/Chest: Effort normal.  Musculoskeletal: Normal range of motion.  Neurological:  She is alert and oriented to person, place, and time.  Skin: Skin is warm and dry.  Two 2 cm x 3 cm fluctuant abscesses at left axilla with surrounding induration and erythema that extends approximately 4-5 cm from area of abscess  Psychiatric: She has a normal mood and affect. Her behavior is normal.    ED Course  INCISION AND DRAINAGE Date/Time: 01/28/2014 3:31 PM Performed by: Griselda Miner LEE Authorized by: Lynne Leader, S Consent: Verbal consent obtained. written consent not obtained. Risks and benefits: risks, benefits and alternatives were discussed Consent given by: patient Patient understanding: patient states understanding of the procedure being performed Patient identity confirmed: verbally with patient and arm band Time out: Immediately prior to procedure a "time out" was called to verify the correct patient, procedure,  equipment, support staff and site/side marked as required. Type: abscess Body area: upper extremity (left axilla) Anesthesia: local infiltration Local anesthetic: lidocaine 2% with epinephrine Anesthetic total: 1.5 ml Patient sedated: no Scalpel size: 11 Incision type: single straight Complexity: simple Drainage: purulent Drainage amount: scant Wound treatment: wound left open Patient tolerance: Patient tolerated the procedure well with no immediate complications. Comments: A second left axillary abscess adjacent to one listed above incised and drained in same fashion.    (including critical care time) Labs Review Labs Reviewed - No data to display  Imaging Review No results found.   MDM   1. Abscess of axilla, left   2. Cellulitis of axilla, left    BID  Dressing changes with tylenol or ibuprofen as directed on packaging for pain and doxycycline as prescribed for associated cellulitis. Warm compresses BID until healed. Follow up if no improvement.   [place], [place] 01/28/14 1538

## 2014-02-03 NOTE — ED Provider Notes (Signed)
Medical screening examination/treatment/procedure(s) were performed by a resident physician or non-physician practitioner and as the supervising physician I was immediately available for consultation/collaboration.  Lynne Leader, MD    Gregor Hams, MD 02/03/14 [phone]

## 2014-06-21 ENCOUNTER — Encounter (HOSPITAL_COMMUNITY): Payer: Self-pay | Admitting: Emergency Medicine

## 2015-09-15 ENCOUNTER — Ambulatory Visit: Payer: No Typology Code available for payment source | Admitting: Sports Medicine

## 2015-11-14 ENCOUNTER — Encounter (HOSPITAL_COMMUNITY): Payer: Self-pay | Admitting: *Deleted

## 2015-11-14 ENCOUNTER — Ambulatory Visit: Payer: BLUE CROSS/BLUE SHIELD | Admitting: Sports Medicine

## 2015-11-14 ENCOUNTER — Emergency Department (HOSPITAL_COMMUNITY)
Admission: EM | Admit: 2015-11-14 | Discharge: 2015-11-14 | Disposition: A | Discharge status: Home / Self Care | Payer: BLUE CROSS/BLUE SHIELD | Source: Home / Self Care | Attending: Family Medicine | Admitting: Family Medicine

## 2015-11-14 DIAGNOSIS — R0982 Postnasal drip: Secondary | ICD-10-CM

## 2015-11-14 DIAGNOSIS — J3489 Other specified disorders of nose and nasal sinuses: Secondary | ICD-10-CM | POA: Diagnosis not present

## 2015-11-14 DIAGNOSIS — J069 Acute upper respiratory infection, unspecified: Secondary | ICD-10-CM

## 2015-11-14 DIAGNOSIS — J9801 Acute bronchospasm: Secondary | ICD-10-CM

## 2015-11-14 DIAGNOSIS — R059 Cough, unspecified: Secondary | ICD-10-CM

## 2015-11-14 DIAGNOSIS — B349 Viral infection, unspecified: Secondary | ICD-10-CM

## 2015-11-14 DIAGNOSIS — R05 Cough: Secondary | ICD-10-CM

## 2015-11-14 MED ORDER — ALBUTEROL SULFATE (2.5 MG/3ML) 0.083% IN NEBU
2.5000 mg | INHALATION_SOLUTION | Freq: Once | RESPIRATORY_TRACT | Status: AC
  Administered 2015-11-14: 2.5 mg via RESPIRATORY_TRACT

## 2015-11-14 MED ORDER — ALBUTEROL SULFATE HFA 108 (90 BASE) MCG/ACT IN AERS: 2.0000 | INHALATION_SPRAY | RESPIRATORY_TRACT | Status: DC | PRN

## 2015-11-14 MED ORDER — IPRATROPIUM-ALBUTEROL 0.5-2.5 (3) MG/3ML IN SOLN
3.0000 mL | Freq: Once | RESPIRATORY_TRACT | Status: AC
  Administered 2015-11-14: 3 mL via RESPIRATORY_TRACT

## 2015-11-14 MED ORDER — ALBUTEROL SULFATE (2.5 MG/3ML) 0.083% IN NEBU
INHALATION_SOLUTION | RESPIRATORY_TRACT | Status: AC
Start: 2015-11-14 — End: 2015-11-14
  Filled 2015-11-14: qty 3

## 2015-11-14 MED ORDER — IPRATROPIUM-ALBUTEROL 0.5-2.5 (3) MG/3ML IN SOLN
RESPIRATORY_TRACT | Status: AC
  Filled 2015-11-14: qty 3

## 2015-11-14 MED ORDER — IPRATROPIUM BROMIDE 0.06 % NA SOLN: 2.0000 | Freq: Four times a day (QID) | NASAL | Status: DC

## 2015-11-14 MED ORDER — PREDNISONE 20 MG PO TABS: ORAL_TABLET | ORAL | Status: DC

## 2015-11-14 NOTE — ED Provider Notes (Signed)
CSN: [id number]     Arrival date & time 11/14/15  1300 History   First MD Initiated Contact with Patient 11/14/15 1317     Chief Complaint  Patient presents with  . Nasal Congestion  . Sore Throat  . Otalgia   (Consider location/radiation/quality/duration/timing/severity/associated sxs/prior Treatment) HPI Comments: 45 year old female with a five-day history of cough, chest pain associated with cough, chest congestion, runny nose, stuffy nose and PND. Denies fevers. Denies asthma or smoking. She is currently afebrile. She is crying while discussing her symptoms. Chest pain is in the back and anterior torso. It is elicited with cough and movement. She also states that several days ago with the onset of feeling sick she had muscle aches and pains associated with weakness fatigue and malaise.   Past Medical History  Diagnosis Date  . Breast cyst     right  . Degenerative disc disease   . Abnormal finding on Pap smear   . Anxiety   . Migraine     h/o migraines- last one > one yr.    Past Surgical History  Procedure Laterality Date  . Laparoscopy      biopsy of bilateral uterosacral ligaments and lysis of adhesions  . Bunionectomy    . Breast surgery  06/29/2009    fibroadenoma axilla- R   . Tonsillectomy      as child   . Tubal ligation    . Dilation and curettage of uterus      ablation- 2005  . Breast biopsy  08/22/2011    Procedure: BREAST BIOPSY;  Surgeon: Harl Bowie, MD;  Location: [place];  Service: General;  Laterality: Left;  excision left breast mass  . Colposcopy    . Uterine ablation     Family History  Problem Relation Age of Onset  . Cancer Mother     kidney  . Cancer Maternal Aunt     breast, lung  . Cancer Maternal Uncle     lung, stomach  . Cancer Maternal Grandmother     ovarian  . Anesthesia problems Neg Hx   . Hypotension Neg Hx   . Malignant hyperthermia Neg Hx   . Pseudochol deficiency Neg Hx    Social History  Substance Use Topics  .  Smoking status: Never Smoker   . Smokeless tobacco: Never Used  . Alcohol Use: Yes     Comment: occasional- wine once a month   OB History    Gravida Para Term Preterm AB TAB SAB Ectopic Multiple Living   3 2 2  1  1   2      Review of Systems  Constitutional: Positive for activity change. Negative for fever, chills, appetite change and fatigue.  HENT: Positive for congestion, postnasal drip and rhinorrhea. Negative for ear discharge, facial swelling and sore throat.   Eyes: Negative.   Respiratory: Positive for cough.   Cardiovascular: Negative.   Gastrointestinal: Negative.   Genitourinary: Negative.   Musculoskeletal: Negative for neck pain and neck stiffness.  Skin: Negative for pallor and rash.  Neurological: Negative.   All other systems reviewed and are negative.   Allergies  Morphine and related  Home Medications   Prior to Admission medications   Medication Sig Start Date End Date Taking? Authorizing Provider  albuterol (PROVENTIL HFA;VENTOLIN HFA) 108 (90 Base) MCG/ACT inhaler Inhale 2 puffs into the lungs every 4 (four) hours as needed for wheezing or shortness of breath. 11/14/15   Janne Napoleon, NP  ipratropium (ATROVENT) 0.06 %  nasal spray Place 2 sprays into both nostrils 4 (four) times daily. 11/14/15   Janne Napoleon, NP  predniSONE (DELTASONE) 20 MG tablet 3 Tabs PO Days 1-3, then 2 tabs PO Days 4-6, then 1 tab PO Day 7-9, then Half Tab PO Day 10-12 11/14/15   Janne Napoleon, NP   Meds Ordered and Administered this Visit   Medications  ipratropium-albuterol (DUONEB) 0.5-2.5 (3) MG/3ML nebulizer solution 3 mL (3 mLs Nebulization Given 11/14/15 1348)  albuterol (PROVENTIL) (2.5 MG/3ML) 0.083% nebulizer solution 2.5 mg (2.5 mg Nebulization Given 11/14/15 1348)    BP 124/89 mmHg  Pulse 97  Temp(Src) 97.9 F (36.6 C) (Oral)  Resp 20  SpO2 100% No data found.   Physical Exam  Constitutional: She appears well-developed and well-nourished. No distress.  HENT:   Mouth/Throat: No oropharyngeal exudate.  Bilateral TMs are normal. Oropharynx with minor erythema and cobblestoning. Positive for clear PND. No swelling or exudates.  Neck: Normal range of motion. Neck supple.  Cardiovascular: Normal rate, regular rhythm and normal heart sounds.   Pulmonary/Chest: Effort normal. No respiratory distress.  With tidal volume no wheezing. With forced expiration and cough there is produced coarseness and wheeze.  Musculoskeletal: She exhibits no edema.  Lymphadenopathy:    She has no cervical adenopathy.  Neurological: She is alert. No cranial nerve deficit. She exhibits normal muscle tone.  Skin: Skin is warm and dry.  Psychiatric: She has a normal mood and affect.  Nursing note and vitals reviewed.   ED Course  Procedures (including critical care time)  Labs Review Labs Reviewed - No data to display  Imaging Review No results found.   Visual Acuity Review  Right Eye Distance:   Left Eye Distance:   Bilateral Distance:    Right Eye Near:   Left Eye Near:    Bilateral Near:         MDM   1. URI (upper respiratory infection)   2. Bronchospasm, acute   3. PND (post-nasal drip)   4. Sinus drainage   5. Cough   6. Viral syndrome     For nasal and head congestion may take Sudafed PE 10 mg every 4 hours as needed. Saline nasal spray used frequently. For drainage may use Allegra, Claritin or Zyrtec. If you need stronger medicine to stop drainage may take Chlor-Trimeton 2-4 mg every 4 hours. This may cause drowsiness. Ibuprofen 600 mg every 6 hours as needed for pain, discomfort or fever. Drink plenty of fluids and stay well-hydrated. Albuterol HFA and Prednisone as directed. Read instructions for use Meds ordered this encounter  Medications  . ipratropium-albuterol (DUONEB) 0.5-2.5 (3) MG/3ML nebulizer solution 3 mL    Sig:   . albuterol (PROVENTIL) (2.5 MG/3ML) 0.083% nebulizer solution 2.5 mg    Sig:   . albuterol (PROVENTIL  HFA;VENTOLIN HFA) 108 (90 Base) MCG/ACT inhaler    Sig: Inhale 2 puffs into the lungs every 4 (four) hours as needed for wheezing or shortness of breath.    Dispense:  1 Inhaler    Refill:  0    Order Specific Question:  Supervising Provider    Answer:  Ihor Gully D [id number]  . predniSONE (DELTASONE) 20 MG tablet    Sig: 3 Tabs PO Days 1-3, then 2 tabs PO Days 4-6, then 1 tab PO Day 7-9, then Half Tab PO Day 10-12    Dispense:  20 tablet    Refill:  0    Order Specific Question:  Supervising Provider  Answer:  Billy Fischer [id number]  . ipratropium (ATROVENT) 0.06 % nasal spray    Sig: Place 2 sprays into both nostrils 4 (four) times daily.    Dispense:  15 mL    Refill:  12    Order Specific Question:  Supervising Provider    Answer:  Billy Fischer [phone]   Post DuoNeb/albuterol treatment as noted above patient states she is breathing better and feeling better. She is moving air well. No wheezing. With forced cough there is some coarseness but less wheezing. The cough is loose and likely due to the PND. No fevers, increased trouble breathing, worsening cough rate checked promptly. Recommend obtaining a PCP as soon as possible if he did not have one.   Janne Napoleon, NP 11/14/15 1421

## 2015-11-14 NOTE — ED Notes (Signed)
Patient reports cold like symptoms, mild fever, cough, chest discomfort, x 1 week.

## 2015-11-14 NOTE — Discharge Instructions (Signed)
Bronchospasm, Adult Albuterol HFA and Prednisone as directed. Read instructions for use A bronchospasm is a spasm or tightening of the airways going into the lungs. During a bronchospasm breathing becomes more difficult because the airways get smaller. When this happens there can be coughing, a whistling sound when breathing (wheezing), and difficulty breathing. Bronchospasm is often associated with asthma, but not all patients who experience a bronchospasm have asthma. CAUSES  A bronchospasm is caused by inflammation or irritation of the airways. The inflammation or irritation may be triggered by:  1. Allergies (such as to animals, pollen, food, or mold). Allergens that cause bronchospasm may cause wheezing immediately after exposure or many hours later.  2. Infection. Viral infections are believed to be the most common cause of bronchospasm.  3. Exercise.  4. Irritants (such as pollution, cigarette smoke, strong odors, aerosol sprays, and paint fumes).  5. Weather changes. Winds increase molds and pollens in the air. Rain refreshes the air by washing irritants out. Cold air may cause inflammation.  6. Stress and emotional upset.  SIGNS AND SYMPTOMS   Wheezing.   Excessive nighttime coughing.   Frequent or severe coughing with a simple cold.   Chest tightness.   Shortness of breath.  DIAGNOSIS  Bronchospasm is usually diagnosed through a history and physical exam. Tests, such as chest X-rays, are sometimes done to look for other conditions. TREATMENT   Inhaled medicines can be given to open up your airways and help you breathe. The medicines can be given using either an inhaler or a nebulizer machine.  Corticosteroid medicines may be given for severe bronchospasm, usually when it is associated with asthma. HOME CARE INSTRUCTIONS   Always have a plan prepared for seeking medical care. Know when to call your health care provider and local emergency services (911 in the [place]  Know where you can access local emergency care.  Only take medicines as directed by your health care provider.  If you were prescribed an inhaler or nebulizer machine, ask your health care provider to explain how to use it correctly. Always use a spacer with your inhaler if you were given one.  It is necessary to remain calm during an attack. Try to relax and breathe more slowly.  Control your home environment in the following ways:   Change your heating and air conditioning filter at least once a month.   Limit your use of fireplaces and wood stoves.  Do not smoke and do not allow smoking in your home.   Avoid exposure to perfumes and fragrances.   Get rid of pests (such as roaches and mice) and their droppings.   Throw away plants if you see mold on them.   Keep your house clean and dust free.   Replace carpet with wood, tile, or vinyl flooring. Carpet can trap dander and dust.   Use allergy-proof pillows, mattress covers, and box spring covers.   Wash bed sheets and blankets every week in hot water and dry them in a dryer.   Use blankets that are made of polyester or cotton.   Wash hands frequently. SEEK MEDICAL CARE IF:   You have muscle aches.   You have chest pain.   The sputum changes from clear or white to yellow, green, gray, or bloody.   The sputum you cough up gets thicker.   There are problems that may be related to the medicine you are given, such as a rash, itching, swelling, or trouble breathing.  [place]  CARE IF:   You have worsening wheezing and coughing even after taking your prescribed medicines.   You have increased difficulty breathing.   You develop severe chest pain. MAKE SURE YOU:   Understand these instructions.  Will watch your condition.  Will get help right away if you are not doing well or get worse.   This information is not intended to replace advice given to you by your health care provider.  Make sure you discuss any questions you have with your health care provider.   Document Released: 08/09/2003 Document Revised: 08/27/2014 Document Reviewed: 01/26/2013 Elsevier Interactive Patient Education 2016 [employer]  Cough, Adult A cough helps to clear your throat and lungs. A cough may last only 2-3 weeks (acute), or it may last longer than 8 weeks (chronic). Many different things can cause a cough. A cough may be a sign of an illness or another medical condition. HOME CARE 7. Pay attention to any changes in your cough. 8. Take medicines only as told by your doctor. 1. If you were prescribed an antibiotic medicine, take it as told by your doctor. Do not stop taking it even if you start to feel better. 2. Talk with your doctor before you try using a cough medicine. 9. Drink enough fluid to keep your pee (urine) clear or pale yellow. 10. If the air is dry, use a cold steam vaporizer or humidifier in your home. 11. Stay away from things that make you cough at work or at home. 12. If your cough is worse at night, try using extra pillows to raise your head up higher while you sleep. 13. Do not smoke, and try not to be around smoke. If you need help quitting, ask your doctor. 14. Do not have caffeine. 15. Do not drink alcohol. 16. Rest as needed. GET HELP IF:  You have new problems (symptoms).  You cough up yellow fluid (pus).  Your cough does not get better after 2-3 weeks, or your cough gets worse.  Medicine does not help your cough and you are not sleeping well.  You have pain that gets worse or pain that is not helped with medicine.  You have a fever.  You are losing weight and you do not know why.  You have night sweats. GET HELP RIGHT AWAY IF:  You cough up blood.  You have trouble breathing.  Your heartbeat is very fast.   This information is not intended to replace advice given to you by your health care provider. Make sure you discuss any questions you have  with your health care provider.   Document Released: 04/19/2011 Document Revised: 04/27/2015 Document Reviewed: 10/13/2014 Elsevier Interactive Patient Education 2016 Elsevier Inc.  Upper Respiratory Infection, Adult For nasal and head congestion may take Sudafed PE 10 mg every 4 hours as needed. Saline nasal spray used frequently. For drainage may use Allegra, Claritin or Zyrtec. If you need stronger medicine to stop drainage may take Chlor-Trimeton 2-4 mg every 4 hours. This may cause drowsiness. Ibuprofen 600 mg every 6 hours as needed for pain, discomfort or fever. Drink plenty of fluids and stay well-hydrated.  Most upper respiratory infections (URIs) are a viral infection of the air passages leading to the lungs. A URI affects the nose, throat, and upper air passages. The most common type of URI is nasopharyngitis and is typically referred to as "the common cold." URIs run their course and usually go away on their own. Most of the time, a URI  does not require medical attention, but sometimes a bacterial infection in the upper airways can follow a viral infection. This is called a secondary infection. Sinus and middle ear infections are common types of secondary upper respiratory infections. Bacterial pneumonia can also complicate a URI. A URI can worsen asthma and chronic obstructive pulmonary disease (COPD). Sometimes, these complications can require emergency medical care and may be life threatening.  CAUSES Almost all URIs are caused by viruses. A virus is a type of germ and can spread from one person to another.  RISKS FACTORS You may be at risk for a URI if:  17. You smoke.  18. You have chronic heart or lung disease. 19. You have a weakened defense (immune) system.  17. You are very young or very old.  21. You have nasal allergies or asthma. 47. You work in crowded or poorly ventilated areas. 47. You work in health care facilities or schools. SIGNS AND SYMPTOMS  Symptoms  typically develop 2-3 days after you come in contact with a cold virus. Most viral URIs last 7-10 days. However, viral URIs from the influenza virus (flu virus) can last 14-18 days and are typically more severe. Symptoms may include:   Runny or stuffy (congested) nose.   Sneezing.   Cough.   Sore throat.   Headache.   Fatigue.   Fever.   Loss of appetite.   Pain in your forehead, behind your eyes, and over your cheekbones (sinus pain).  Muscle aches.  DIAGNOSIS  Your health care provider may diagnose a URI by:  Physical exam.  Tests to check that your symptoms are not due to another condition such as:  Strep throat.  Sinusitis.  Pneumonia.  Asthma. TREATMENT  A URI goes away on its own with time. It cannot be cured with medicines, but medicines may be prescribed or recommended to relieve symptoms. Medicines may help:  Reduce your fever.  Reduce your cough.  Relieve nasal congestion. HOME CARE INSTRUCTIONS   Take medicines only as directed by your health care provider.   Gargle warm saltwater or take cough drops to comfort your throat as directed by your health care provider.  Use a warm mist humidifier or inhale steam from a shower to increase air moisture. This may make it easier to breathe.  Drink enough fluid to keep your urine clear or pale yellow.   Eat soups and other clear broths and maintain good nutrition.   Rest as needed.   Return to work when your temperature has returned to normal or as your health care provider advises. You may need to stay home longer to avoid infecting others. You can also use a face mask and careful hand washing to prevent spread of the virus.  Increase the usage of your inhaler if you have asthma.   Do not use any tobacco products, including cigarettes, chewing tobacco, or electronic cigarettes. If you need help quitting, ask your health care provider. PREVENTION  The best way to protect yourself from  getting a cold is to practice good hygiene.   Avoid oral or hand contact with people with cold symptoms.   Wash your hands often if contact occurs.  There is no clear evidence that vitamin C, vitamin E, echinacea, or exercise reduces the chance of developing a cold. However, it is always recommended to get plenty of rest, exercise, and practice good nutrition.  SEEK MEDICAL CARE IF:   You are getting worse rather than better.   Your symptoms  are not controlled by medicine.   You have chills.  You have worsening shortness of breath.  You have brown or red mucus.  You have yellow or brown nasal discharge.  You have pain in your face, especially when you bend forward.  You have a fever.  You have swollen neck glands.  You have pain while swallowing.  You have white areas in the back of your throat. SEEK IMMEDIATE MEDICAL CARE IF:   You have severe or persistent:  Headache.  Ear pain.  Sinus pain.  Chest pain.  You have chronic lung disease and any of the following:  Wheezing.  Prolonged cough.  Coughing up blood.  A change in your usual mucus.  You have a stiff neck.  You have changes in your:  Vision.  Hearing.  Thinking.  Mood. MAKE SURE YOU:   Understand these instructions.  Will watch your condition.  Will get help right away if you are not doing well or get worse.   This information is not intended to replace advice given to you by your health care provider. Make sure you discuss any questions you have with your health care provider.   Document Released: 01/30/2001 Document Revised: 12/21/2014 Document Reviewed: 11/11/2013 Elsevier Interactive Patient Education 2016 [employer].  How to Use an Inhaler Using your inhaler correctly is very important. Good technique will make sure that the medicine reaches your lungs.  HOW TO USE AN INHALER: 24. Take the cap off the inhaler. 25. If this is the first time using your inhaler, you need  to prime it. Shake the inhaler for 5 seconds. Release four puffs into the air, away from your face. Ask your doctor for help if you have questions. 26. Shake the inhaler for 5 seconds. 27. Turn the inhaler so the bottle is above the mouthpiece. 28. Put your pointer finger on top of the bottle. Your thumb holds the bottom of the inhaler. 29. Open your mouth. 30. Either hold the inhaler away from your mouth (the width of 2 fingers) or place your lips tightly around the mouthpiece. Ask your doctor which way to use your inhaler. 31. Breathe out as much air as possible. 32. Breathe in and push down on the bottle 1 time to release the medicine. You will feel the medicine go in your mouth and throat. 33. Continue to take a deep breath in very slowly. Try to fill your lungs. 34. After you have breathed in completely, hold your breath for 10 seconds. This will help the medicine to settle in your lungs. If you cannot hold your breath for 10 seconds, hold it for as long as you can before you breathe out. 35. Breathe out slowly, through pursed lips. Whistling is an example of pursed lips. 36. If your doctor has told you to take more than 1 puff, wait at least 15-30 seconds between puffs. This will help you get the best results from your medicine. Do not use the inhaler more than your doctor tells you to. 37. Put the cap back on the inhaler. 38. Follow the directions from your doctor or from the inhaler package about cleaning the inhaler. If you use more than one inhaler, ask your doctor which inhalers to use and what order to use them in. Ask your doctor to help you figure out when you will need to refill your inhaler.  If you use a steroid inhaler, always rinse your mouth with water after your last puff, gargle and spit out  the water. Do not swallow the water. GET HELP IF:  The inhaler medicine only partially helps to stop wheezing or shortness of breath.  You are having trouble using your inhaler.  You  have some increase in thick spit (phlegm). GET HELP RIGHT AWAY IF:  The inhaler medicine does not help your wheezing or shortness of breath or you have tightness in your chest.  You have dizziness, headaches, or fast heart rate.  You have chills, fever, or night sweats.  You have a large increase of thick spit, or your thick spit is bloody. MAKE SURE YOU:   Understand these instructions.  Will watch your condition.  Will get help right away if you are not doing well or get worse.   This information is not intended to replace advice given to you by your health care provider. Make sure you discuss any questions you have with your health care provider.   Document Released: 05/15/2008 Document Revised: 05/27/2013 Document Reviewed: 03/05/2013 Elsevier Interactive Patient Education [employer].

## 2015-11-21 ENCOUNTER — Ambulatory Visit (INDEPENDENT_AMBULATORY_CARE_PROVIDER_SITE_OTHER): Payer: BLUE CROSS/BLUE SHIELD | Admitting: Sports Medicine

## 2015-11-21 ENCOUNTER — Encounter: Payer: Self-pay | Admitting: Sports Medicine

## 2015-11-21 VITALS — BP 124/83 | HR 81 | Ht 65.0 in | Wt 144.0 lb

## 2015-11-21 DIAGNOSIS — G5762 Lesion of plantar nerve, left lower limb: Secondary | ICD-10-CM | POA: Diagnosis not present

## 2015-11-21 DIAGNOSIS — M2142 Flat foot [pes planus] (acquired), left foot: Secondary | ICD-10-CM | POA: Diagnosis not present

## 2015-11-21 DIAGNOSIS — M2141 Flat foot [pes planus] (acquired), right foot: Secondary | ICD-10-CM

## 2015-11-22 NOTE — Progress Notes (Signed)
   Subjective:    Patient ID: Wendy [name], female    DOB: 04/07/1971, 45 y.o.   MRN: [id number]  HPI chief complaint: Left foot pain  Very pleasant 45 year old female comes in today at the request of Dr. Noemi Chapel for custom orthotics. She saw Dr. Noemi Chapel on March 16. Diagnosed her with a Morton's neuroma on the left foot as well as pes planus. He sent her to our office for custom orthotics and a metatarsal pad on the left. Patient has had pain in her forefoot for the past 3 months. Occasional numbness in the third and fourth toes. No trauma. No pain elsewhere in the foot.  Past medical history unremarkable Surgical history significant for a prior bunionectomy of her left foot at [place] in 2003 She is allergic to morphine Socially history: Nonsmoker. 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 No acute distress.  Examination of her feet in the standing position shows mild pes planus bilaterally. Positive Mudler's on the left. She is tender to palpation along the metatarsal heads but also along the arch of the foot. Neurovascularly intact distally. Walking without a limp.      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 We did put a metatarsal pad on the left orthotic. Patient found the orthotics to be comfortable prior to leaving the office. She will follow-up with Dr. Noemi Chapel if symptoms persist. Follow-up with [place] when necessary.  Total time spent with the patient was 30 minutes with greater than 50% of the time spent in face-to-face consultation discussing orthotic construction, instruction, and fitting.  Patient was fitted for a : standard, cushioned, semi-rigid orthotic. The orthotic was heated and afterward the patient stood on the orthotic blank positioned on the orthotic stand. The patient was positioned in  subtalar neutral position and 10 degrees of ankle dorsiflexion in a weight bearing stance. After completion of molding, a stable base was applied to the orthotic blank. The blank was ground to a stable position for weight bearing. Size: 6 Base: Blue EVA Posting: none Additional orthotic padding: left MT pad

## 2016-01-03 ENCOUNTER — Other Ambulatory Visit: Payer: Self-pay | Admitting: Gastroenterology

## 2016-01-03 DIAGNOSIS — R1310 Dysphagia, unspecified: Secondary | ICD-10-CM

## 2016-01-05 ENCOUNTER — Other Ambulatory Visit: Payer: BLUE CROSS/BLUE SHIELD

## 2016-01-06 ENCOUNTER — Ambulatory Visit
Admission: RE | Admit: 2016-01-06 | Discharge: 2016-01-06 | Disposition: A | Discharge status: Home / Self Care | Payer: BLUE CROSS/BLUE SHIELD | Source: Ambulatory Visit | Attending: Gastroenterology | Admitting: Gastroenterology

## 2016-01-06 DIAGNOSIS — R1310 Dysphagia, unspecified: Secondary | ICD-10-CM

## 2016-01-27 ENCOUNTER — Other Ambulatory Visit: Payer: Self-pay | Admitting: Gastroenterology

## 2016-01-27 DIAGNOSIS — R1011 Right upper quadrant pain: Secondary | ICD-10-CM

## 2016-01-27 DIAGNOSIS — R140 Abdominal distension (gaseous): Secondary | ICD-10-CM

## 2016-02-06 ENCOUNTER — Ambulatory Visit
Admission: RE | Admit: 2016-02-06 | Discharge: 2016-02-06 | Disposition: A | Discharge status: Home / Self Care | Payer: BLUE CROSS/BLUE SHIELD | Source: Ambulatory Visit | Attending: Gastroenterology | Admitting: Gastroenterology

## 2016-02-06 DIAGNOSIS — R140 Abdominal distension (gaseous): Secondary | ICD-10-CM

## 2016-03-21 ENCOUNTER — Other Ambulatory Visit: Payer: Self-pay | Admitting: Obstetrics and Gynecology

## 2016-03-21 DIAGNOSIS — K582 Mixed irritable bowel syndrome: Secondary | ICD-10-CM

## 2016-03-29 ENCOUNTER — Ambulatory Visit
Admission: RE | Admit: 2016-03-29 | Discharge: 2016-03-29 | Disposition: A | Discharge status: Home / Self Care | Payer: BLUE CROSS/BLUE SHIELD | Source: Ambulatory Visit | Attending: Obstetrics and Gynecology | Admitting: Obstetrics and Gynecology

## 2016-03-29 DIAGNOSIS — K582 Mixed irritable bowel syndrome: Secondary | ICD-10-CM

## 2016-03-29 MED ORDER — IOPAMIDOL (ISOVUE-300) INJECTION 61%
100.0000 mL | Freq: Once | INTRAVENOUS | Status: AC | PRN
  Administered 2016-03-29: 100 mL via INTRAVENOUS

## 2016-05-13 ENCOUNTER — Encounter: Payer: Self-pay | Admitting: *Deleted

## 2016-09-20 ENCOUNTER — Ambulatory Visit (INDEPENDENT_AMBULATORY_CARE_PROVIDER_SITE_OTHER): Payer: Self-pay | Admitting: Orthopedic Surgery

## 2016-10-29 ENCOUNTER — Other Ambulatory Visit: Payer: Self-pay

## 2016-11-06 ENCOUNTER — Ambulatory Visit (INDEPENDENT_AMBULATORY_CARE_PROVIDER_SITE_OTHER): Payer: Self-pay | Admitting: Orthopedic Surgery

## 2016-11-09 ENCOUNTER — Other Ambulatory Visit: Payer: Self-pay | Admitting: General Surgery

## 2016-11-09 DIAGNOSIS — D242 Benign neoplasm of left breast: Secondary | ICD-10-CM

## 2016-11-18 DIAGNOSIS — D242 Benign neoplasm of left breast: Secondary | ICD-10-CM

## 2016-11-18 HISTORY — DX: Benign neoplasm of left breast: D24.2

## 2016-11-23 ENCOUNTER — Encounter (HOSPITAL_BASED_OUTPATIENT_CLINIC_OR_DEPARTMENT_OTHER): Payer: Self-pay | Admitting: *Deleted

## 2016-11-23 NOTE — Pre-Procedure Instructions (Signed)
To come pick up Boost Breeze 8 oz. to drink by 0830 DOS

## 2016-11-28 NOTE — H&P (Signed)
Wendy [name] Location: [facility] Patient #: [id number] DOB: 1971/07/21 Divorced / Language: English / Race: Black or African American Female        History of Present Illness       The patient is a 46 year old female who presents with a complaint of papilloma left breast. This is a very pleasant 46 year old African American female. Her sister is with her throughout the encounter. She is referred by Dr. Gae Dry for excision of a papilloma of the left breast, 3 o'clock position.      The patient has a history of a right breast excision about 10 years ago. She thinks this was a cyst in the wound may have been infected. She also has a history of a left breast excision upper outer quadrant for fibroadenoma 2011.       She recently felt bilateral lumpy areas. She presented for imaging studies at [facility]. They found a benign appearing 1.5 cm cyst on the right at the 1 o'clock position, 2 cm from the nipple. They found a 9 mm solid mass in the left breast at the 3 o'clock position, 2 cm from the nipple. There are multiple cysts in the upper outer left breast Ultrasound of the left axilla was negative Image guided biopsy of the 9 mm mass left breast 3 o'clock position revealed sclerosing papillary lesion with florid usual ductal hyperplasia and other scarring phenomenon but no atypia. She is had a tiny hematoma but not bad.      Past history significant for fibroadenoma left breast excised 2011. Right breast cyst excised 2008. GERD, chronic. Family history reveals maternal aunt died of breast cancer in her 76s. Maternal grandmother died of ovarian cancer. Maternal uncle died of prostate cancer Social history reveals she is single. Has 2 children. Denies tobacco. Alcohol occasionally. 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      We had a long talk. She is very anxious about her diagnosis. Even asked if she needed a mastectomy  which I told her she did not. I advised conservative excision of this area and review of pathology and treatment accordingly I told her we would consider referral to the high risk clinic because of her multiple biopsies and family history but that I would like to get this histology documented first. She understands and completely agrees.  she'll be scheduled for left breast lumpectomy with radioactive seed localization. I discussed the indications, details, techniques, and numerous risk of the surgery with her and her sister. She is aware the risks of bleeding, infection, cosmetic deformity, nerve damage with chronic pain or numbness, reoperation of cancer, and other unforeseen problems. She understands these issues. All of her questions were answered. She agrees with this plan.   Past Surgical History  Breast Biopsy  Bilateral. multiple Foot Surgery  Bilateral. Oral Surgery   Diagnostic Studies History  Colonoscopy  never Mammogram  within last year Pap Smear  1-5 years ago  Allergies  Morphine Derivatives  Itching Allergies Reconciled   Medication History Dicyclomine HCl (20MG  Tablet, Oral as needed) Active. Meloxicam (15MG  Tablet, Oral daily) Active. Vitamin D3 (50000UNIT Capsule, Oral twice a day) Active. Omeprazole (20MG  Capsule DR, Oral daily) Active. Medications Reconciled  Social History  Alcohol use  Occasional alcohol use. Caffeine use  Coffee. No drug use  Tobacco use  Never smoker.  Family History  Arthritis  Mother. Cancer  Mother. Cerebrovascular Accident  Father. Colon Polyps  Mother. Depression  Mother. Diabetes Mellitus  Mother. Hypertension  Mother.  Pregnancy / Birth History Age at menarche  53 years. Gravida  3 Irregular periods  Length (months) of breastfeeding  3-6 Maternal age  51-25 Para  3  Other Problems  Gastroesophageal Reflux Disease  Heart murmur  Hypercholesterolemia  Lump In Breast   Migraine Headache     Review of Systems  General Present- Appetite Loss, Fatigue, Night Sweats and Weight Loss. Not Present- Chills, Fever and Weight Gain. Skin Not Present- Change in Wart/Mole, Dryness, Hives, Jaundice, New Lesions, Non-Healing Wounds, Rash and Ulcer. HEENT Present- Wears glasses/contact lenses. Not Present- Earache, Hearing Loss, Hoarseness, Nose Bleed, Oral Ulcers, Ringing in the Ears, Seasonal Allergies, Sinus Pain, Sore Throat, Visual Disturbances and Yellow Eyes. Respiratory Not Present- Bloody sputum, Chronic Cough, Difficulty Breathing, Snoring and Wheezing. Breast Present- Breast Pain. Not Present- Breast Mass, Nipple Discharge and Skin Changes. Cardiovascular Present- Shortness of Breath. Not Present- Chest Pain, Difficulty Breathing Lying Down, Leg Cramps, Palpitations, Rapid Heart Rate and Swelling of Extremities. Gastrointestinal Present- Abdominal Pain, Bloating, Change in Bowel Habits, Difficulty Swallowing, Gets full quickly at meals and Nausea. Not Present- Bloody Stool, Chronic diarrhea, Constipation, Excessive gas, Hemorrhoids, Indigestion, Rectal Pain and Vomiting. Female Genitourinary Present- Frequency. Not Present- Nocturia, Painful Urination, Pelvic Pain and Urgency. Musculoskeletal Not Present- Back Pain, Joint Pain, Joint Stiffness, Muscle Pain, Muscle Weakness and Swelling of Extremities. Neurological Not Present- Decreased Memory, Fainting, Headaches, Numbness, Seizures, Tingling, Tremor, Trouble walking and Weakness. Endocrine Not Present- Cold Intolerance, Excessive Hunger, Hair Changes, Heat Intolerance, Hot flashes and New Diabetes. Hematology Not Present- Blood Thinners, Easy Bruising, Excessive bleeding, Gland problems, HIV and Persistent Infections.  Vitals  Weight: 156.2 lb Height: 65in Body Surface Area: 1.78 m Body Mass Index: 25.99 kg/m  Temp.: 98.57F  Pulse: 80 (Regular)  BP: 122/78 (Sitting, Left Arm,  Standard)     Physical Exam  General Mental Status-Alert. General Appearance-Consistent with stated age. Hydration-Well hydrated. Voice-Normal. Note: Healthy. Pleasant. Good insight. Anxious. Sister present.   Head and Neck Head-normocephalic, atraumatic with no lesions or palpable masses. Trachea-midline. Thyroid Gland Characteristics - normal size and consistency.  Eye Eyeball - Bilateral-Extraocular movements intact. Sclera/Conjunctiva - Bilateral-No scleral icterus.  Chest and Lung Exam Chest and lung exam reveals -quiet, even and easy respiratory effort with no use of accessory muscles and on auscultation, normal breath sounds, no adventitious sounds and normal vocal resonance. Inspection Chest Wall - Normal. Back - normal.  Breast Note: Breasts are medium sized. Soft. Skin healthy. Well-healed biopsy scar right breast upper outer quadrant. Well-healed biopsy scar left breast upper outer quadrant. A little lumpy throughout. I can feel the small cyst in the right breast at the 1 o'clock position. There is a little bit of ecchymoses and a little bit of a hematoma in the left breast at the 3 o'clock position. No nipple discharge. Nipple and areolar skin looked fine.   Cardiovascular Cardiovascular examination reveals -normal heart sounds, regular rate and rhythm with no murmurs and normal pedal pulses bilaterally.  Abdomen Inspection Inspection of the abdomen reveals - No Hernias. Skin - Scar - no surgical scars. Palpation/Percussion Palpation and Percussion of the abdomen reveal - Soft, Non Tender, No Rebound tenderness, No Rigidity (guarding) and No hepatosplenomegaly. Auscultation Auscultation of the abdomen reveals - Bowel sounds normal.  Neurologic Neurologic evaluation reveals -alert and oriented x 3 with no impairment of recent or remote memory. Mental Status-Normal.  Musculoskeletal Normal Exam - Left-Upper Extremity Strength  Normal and Lower Extremity Strength Normal. Normal Exam - Right-Upper Extremity Strength Normal and Lower Extremity Strength Normal.  Lymphatic Head & Neck  General Head & Neck Lymphatics: Bilateral - Description - Normal. Axillary  General Axillary Region: Bilateral - Description - Normal. Tenderness - Non Tender. Femoral & Inguinal  Generalized Femoral & Inguinal Lymphatics: Bilateral - Description - Normal. Tenderness - Non Tender.    Assessment & Plan  PAPILLOMA OF LEFT BREAST (D24.2)      Your recent imaging studies and biopsies show an intraductal papilloma and florid ductal hyperplasia of the left breast, laterally, 3 o'clock position. There was no atypia We have discussed the low but definite risk that there could be atypical cells or early breast cancer in the area This risk is no more than 10% Most likely you do not have cancer We have recommended conservative excision of this area and you have stated that you would like to do that  you will be scheduled for left breast lumpectomy with radioactive seed localization We have discussed the indications, techniques, and risk of the surgery in detail Please read the printed information we have given  CHRONIC GERD (K21.9) HISTORY OF RIGHT BREAST BIOPSY (Z98.890) HISTORY OF LEFT BREAST BIOPSY (Z98.890) FAMILY HISTORY OF BREAST CANCER (Z80.3) Impression: Maternal aunt   Edsel Petrin. Dalbert Batman, M.D., [facility] General and Minimally invasive Surgery Breast and Colorectal Surgery Office:   [phone] Pager:   [phone]

## 2016-11-28 NOTE — Progress Notes (Signed)
Pt in to pick up Boost Breeze for surgery Friday.  Instructions reviewed.

## 2016-11-30 ENCOUNTER — Ambulatory Visit (HOSPITAL_BASED_OUTPATIENT_CLINIC_OR_DEPARTMENT_OTHER)
Admission: RE | Admit: 2016-11-30 | Discharge: 2016-11-30 | Disposition: A | Discharge status: Home / Self Care | Payer: BLUE CROSS/BLUE SHIELD | Source: Ambulatory Visit | Attending: General Surgery | Admitting: General Surgery

## 2016-11-30 ENCOUNTER — Encounter (HOSPITAL_BASED_OUTPATIENT_CLINIC_OR_DEPARTMENT_OTHER): Payer: Self-pay | Admitting: Anesthesiology

## 2016-11-30 ENCOUNTER — Encounter (HOSPITAL_BASED_OUTPATIENT_CLINIC_OR_DEPARTMENT_OTHER)
Admission: RE | Disposition: A | Discharge status: Home / Self Care | Payer: Self-pay | Source: Ambulatory Visit | Attending: General Surgery

## 2016-11-30 ENCOUNTER — Ambulatory Visit (HOSPITAL_BASED_OUTPATIENT_CLINIC_OR_DEPARTMENT_OTHER): Payer: BLUE CROSS/BLUE SHIELD | Admitting: Anesthesiology

## 2016-11-30 DIAGNOSIS — K219 Gastro-esophageal reflux disease without esophagitis: Secondary | ICD-10-CM | POA: Insufficient documentation

## 2016-11-30 DIAGNOSIS — Z8041 Family history of malignant neoplasm of ovary: Secondary | ICD-10-CM | POA: Diagnosis not present

## 2016-11-30 DIAGNOSIS — Z79899 Other long term (current) drug therapy: Secondary | ICD-10-CM | POA: Diagnosis not present

## 2016-11-30 DIAGNOSIS — D242 Benign neoplasm of left breast: Secondary | ICD-10-CM | POA: Insufficient documentation

## 2016-11-30 DIAGNOSIS — N6022 Fibroadenosis of left breast: Secondary | ICD-10-CM | POA: Diagnosis not present

## 2016-11-30 DIAGNOSIS — N6320 Unspecified lump in the left breast, unspecified quadrant: Secondary | ICD-10-CM

## 2016-11-30 DIAGNOSIS — N6325 Unspecified lump in the left breast, overlapping quadrants: Secondary | ICD-10-CM | POA: Diagnosis present

## 2016-11-30 DIAGNOSIS — Z803 Family history of malignant neoplasm of breast: Secondary | ICD-10-CM | POA: Diagnosis not present

## 2016-11-30 DIAGNOSIS — N6092 Unspecified benign mammary dysplasia of left breast: Secondary | ICD-10-CM | POA: Diagnosis not present

## 2016-11-30 HISTORY — DX: Irritable bowel syndrome, unspecified: K58.9

## 2016-11-30 HISTORY — DX: Unspecified osteoarthritis, unspecified site: M19.90

## 2016-11-30 HISTORY — DX: Gastro-esophageal reflux disease without esophagitis: K21.9

## 2016-11-30 HISTORY — DX: Dysphagia, unspecified: R13.10

## 2016-11-30 HISTORY — DX: Benign neoplasm of left breast: D24.2

## 2016-11-30 HISTORY — DX: Personal history of Methicillin resistant Staphylococcus aureus infection: Z86.14

## 2016-11-30 HISTORY — PX: BREAST LUMPECTOMY WITH RADIOACTIVE SEED LOCALIZATION: SHX6424

## 2016-11-30 SURGERY — BREAST LUMPECTOMY WITH RADIOACTIVE SEED LOCALIZATION
Anesthesia: General | Site: Breast | Laterality: Left

## 2016-11-30 MED ORDER — MIDAZOLAM HCL 2 MG/2ML IJ SOLN
INTRAMUSCULAR | Status: AC
  Filled 2016-11-30: qty 2

## 2016-11-30 MED ORDER — CEFAZOLIN SODIUM-DEXTROSE 2-4 GM/100ML-% IV SOLN
INTRAVENOUS | Status: AC
  Filled 2016-11-30: qty 100

## 2016-11-30 MED ORDER — PROPOFOL 10 MG/ML IV BOLUS
INTRAVENOUS | Status: AC
  Filled 2016-11-30: qty 20

## 2016-11-30 MED ORDER — FENTANYL CITRATE (PF) 100 MCG/2ML IJ SOLN
50.0000 ug | INTRAMUSCULAR | Status: DC | PRN
  Administered 2016-11-30 (×2): 50 ug via INTRAVENOUS

## 2016-11-30 MED ORDER — LACTATED RINGERS IV SOLN
INTRAVENOUS | Status: DC
  Administered 2016-11-30 (×2): via INTRAVENOUS

## 2016-11-30 MED ORDER — SCOPOLAMINE 1 MG/3DAYS TD PT72: 1.0000 | MEDICATED_PATCH | Freq: Once | TRANSDERMAL | Status: DC | PRN

## 2016-11-30 MED ORDER — ACETAMINOPHEN 500 MG PO TABS
1000.0000 mg | ORAL_TABLET | ORAL | Status: AC
  Administered 2016-11-30: 1000 mg via ORAL

## 2016-11-30 MED ORDER — CHLORHEXIDINE GLUCONATE CLOTH 2 % EX PADS: 6.0000 | MEDICATED_PAD | Freq: Once | CUTANEOUS | Status: DC

## 2016-11-30 MED ORDER — GABAPENTIN 300 MG PO CAPS
300.0000 mg | ORAL_CAPSULE | ORAL | Status: AC
  Administered 2016-11-30: 300 mg via ORAL

## 2016-11-30 MED ORDER — OXYCODONE HCL 5 MG/5ML PO SOLN: 5.0000 mg | Freq: Once | ORAL | Status: DC | PRN

## 2016-11-30 MED ORDER — PROPOFOL 10 MG/ML IV BOLUS
INTRAVENOUS | Status: DC | PRN
  Administered 2016-11-30: 200 mg via INTRAVENOUS

## 2016-11-30 MED ORDER — SODIUM BICARBONATE 4 % IV SOLN
INTRAVENOUS | Status: DC | PRN
  Administered 2016-11-30: 1 mL via INTRAVENOUS

## 2016-11-30 MED ORDER — LIDOCAINE HCL (CARDIAC) 20 MG/ML IV SOLN
INTRAVENOUS | Status: DC | PRN
  Administered 2016-11-30: 60 mg via INTRAVENOUS

## 2016-11-30 MED ORDER — LIDOCAINE-EPINEPHRINE 1 %-1:100000 IJ SOLN
INTRAMUSCULAR | Status: AC
  Filled 2016-11-30: qty 1

## 2016-11-30 MED ORDER — FENTANYL CITRATE (PF) 100 MCG/2ML IJ SOLN
INTRAMUSCULAR | Status: AC
  Filled 2016-11-30: qty 2

## 2016-11-30 MED ORDER — LIDOCAINE-EPINEPHRINE (PF) 1 %-1:200000 IJ SOLN
INTRAMUSCULAR | Status: DC | PRN
  Administered 2016-11-30: 9 mL

## 2016-11-30 MED ORDER — ACETAMINOPHEN 500 MG PO TABS
ORAL_TABLET | ORAL | Status: AC
  Filled 2016-11-30: qty 2

## 2016-11-30 MED ORDER — FENTANYL CITRATE (PF) 100 MCG/2ML IJ SOLN
INTRAMUSCULAR | Status: AC
Start: 2016-11-30 — End: 2016-11-30
  Filled 2016-11-30: qty 2

## 2016-11-30 MED ORDER — ONDANSETRON HCL 4 MG/2ML IJ SOLN
INTRAMUSCULAR | Status: DC | PRN
  Administered 2016-11-30: 4 mg via INTRAVENOUS

## 2016-11-30 MED ORDER — CEFAZOLIN SODIUM-DEXTROSE 2-4 GM/100ML-% IV SOLN
2.0000 g | INTRAVENOUS | Status: AC
  Administered 2016-11-30: 2 g via INTRAVENOUS

## 2016-11-30 MED ORDER — FENTANYL CITRATE (PF) 100 MCG/2ML IJ SOLN
25.0000 ug | INTRAMUSCULAR | Status: DC | PRN
  Administered 2016-11-30: 50 ug via INTRAVENOUS
  Administered 2016-11-30: 25 ug via INTRAVENOUS

## 2016-11-30 MED ORDER — OXYCODONE HCL 5 MG PO TABS: 5.0000 mg | ORAL_TABLET | Freq: Once | ORAL | Status: DC | PRN

## 2016-11-30 MED ORDER — SODIUM BICARBONATE 4 % IV SOLN
INTRAVENOUS | Status: AC
  Filled 2016-11-30: qty 5

## 2016-11-30 MED ORDER — DEXAMETHASONE SODIUM PHOSPHATE 4 MG/ML IJ SOLN
INTRAMUSCULAR | Status: DC | PRN
  Administered 2016-11-30: 10 mg via INTRAVENOUS

## 2016-11-30 MED ORDER — GABAPENTIN 300 MG PO CAPS
ORAL_CAPSULE | ORAL | Status: AC
  Filled 2016-11-30: qty 1

## 2016-11-30 MED ORDER — MIDAZOLAM HCL 2 MG/2ML IJ SOLN
1.0000 mg | INTRAMUSCULAR | Status: DC | PRN
  Administered 2016-11-30: 2 mg via INTRAVENOUS

## 2016-11-30 MED ORDER — CELECOXIB 400 MG PO CAPS
400.0000 mg | ORAL_CAPSULE | ORAL | Status: AC
  Administered 2016-11-30: 400 mg via ORAL

## 2016-11-30 MED ORDER — CELECOXIB 200 MG PO CAPS
ORAL_CAPSULE | ORAL | Status: AC
  Filled 2016-11-30: qty 2

## 2016-11-30 MED ORDER — HYDROCODONE-ACETAMINOPHEN 5-325 MG PO TABS: 1.0000 | ORAL_TABLET | Freq: Four times a day (QID) | ORAL | 0 refills | Status: DC | PRN

## 2016-11-30 SURGICAL SUPPLY — 65 items
APPLIER CLIP 9.375 MED OPEN (MISCELLANEOUS) ×3
BENZOIN TINCTURE PRP APPL 2/3 (GAUZE/BANDAGES/DRESSINGS) IMPLANT
BINDER BREAST LRG (GAUZE/BANDAGES/DRESSINGS) ×3 IMPLANT
BINDER BREAST MEDIUM (GAUZE/BANDAGES/DRESSINGS) IMPLANT
BINDER BREAST XLRG (GAUZE/BANDAGES/DRESSINGS) IMPLANT
BINDER BREAST XXLRG (GAUZE/BANDAGES/DRESSINGS) IMPLANT
BLADE HEX COATED 2.75 (ELECTRODE) ×3 IMPLANT
BLADE SURG 10 STRL SS (BLADE) IMPLANT
BLADE SURG 15 STRL LF DISP TIS (BLADE) ×1 IMPLANT
BLADE SURG 15 STRL SS (BLADE) ×2
CANISTER SUC SOCK COL 7IN (MISCELLANEOUS) IMPLANT
CANISTER SUCT 1200ML W/VALVE (MISCELLANEOUS) ×3 IMPLANT
CHLORAPREP W/TINT 26ML (MISCELLANEOUS) ×3 IMPLANT
CLIP APPLIE 9.375 MED OPEN (MISCELLANEOUS) ×1 IMPLANT
CLOSURE WOUND 1/2 X4 (GAUZE/BANDAGES/DRESSINGS)
COVER BACK TABLE 60X90IN (DRAPES) ×3 IMPLANT
COVER MAYO STAND STRL (DRAPES) ×3 IMPLANT
COVER PROBE W GEL 5X96 (DRAPES) ×3 IMPLANT
DECANTER SPIKE VIAL GLASS SM (MISCELLANEOUS) IMPLANT
DERMABOND ADVANCED (GAUZE/BANDAGES/DRESSINGS) ×2
DERMABOND ADVANCED .7 DNX12 (GAUZE/BANDAGES/DRESSINGS) ×1 IMPLANT
DEVICE DUBIN W/COMP PLATE 8390 (MISCELLANEOUS) ×3 IMPLANT
DRAPE LAPAROSCOPIC ABDOMINAL (DRAPES) ×3 IMPLANT
DRAPE UTILITY XL STRL (DRAPES) ×3 IMPLANT
DRSG PAD ABDOMINAL 8X10 ST (GAUZE/BANDAGES/DRESSINGS) IMPLANT
ELECT REM PT RETURN 9FT ADLT (ELECTROSURGICAL) ×3
ELECTRODE REM PT RTRN 9FT ADLT (ELECTROSURGICAL) ×1 IMPLANT
GAUZE SPONGE 4X4 12PLY STRL LF (GAUZE/BANDAGES/DRESSINGS) IMPLANT
GLOVE BIOGEL PI IND STRL 6.5 (GLOVE) ×1 IMPLANT
GLOVE BIOGEL PI IND STRL 7.0 (GLOVE) ×2 IMPLANT
GLOVE BIOGEL PI INDICATOR 6.5 (GLOVE) ×2
GLOVE BIOGEL PI INDICATOR 7.0 (GLOVE) ×4
GLOVE ECLIPSE 6.5 STRL STRAW (GLOVE) ×6 IMPLANT
GLOVE EUDERMIC 7 POWDERFREE (GLOVE) ×3 IMPLANT
GLOVE SURG SS PI 7.0 STRL IVOR (GLOVE) ×3 IMPLANT
GOWN STRL REUS W/ TWL LRG LVL3 (GOWN DISPOSABLE) ×4 IMPLANT
GOWN STRL REUS W/ TWL XL LVL3 (GOWN DISPOSABLE) ×1 IMPLANT
GOWN STRL REUS W/TWL LRG LVL3 (GOWN DISPOSABLE) ×8
GOWN STRL REUS W/TWL XL LVL3 (GOWN DISPOSABLE) ×2
ILLUMINATOR WAVEGUIDE N/F (MISCELLANEOUS) IMPLANT
KIT MARKER MARGIN INK (KITS) ×3 IMPLANT
LIGHT WAVEGUIDE WIDE FLAT (MISCELLANEOUS) IMPLANT
NEEDLE HYPO 25X1 1.5 SAFETY (NEEDLE) ×3 IMPLANT
NS IRRIG 1000ML POUR BTL (IV SOLUTION) ×3 IMPLANT
PACK BASIN DAY SURGERY FS (CUSTOM PROCEDURE TRAY) ×3 IMPLANT
PENCIL BUTTON HOLSTER BLD 10FT (ELECTRODE) ×3 IMPLANT
SHEET MEDIUM DRAPE 40X70 STRL (DRAPES) IMPLANT
SLEEVE SCD COMPRESS KNEE MED (MISCELLANEOUS) ×3 IMPLANT
SPONGE LAP 18X18 X RAY DECT (DISPOSABLE) IMPLANT
SPONGE LAP 4X18 X RAY DECT (DISPOSABLE) ×3 IMPLANT
STRIP CLOSURE SKIN 1/2X4 (GAUZE/BANDAGES/DRESSINGS) IMPLANT
SUT ETHILON 3 0 FSL (SUTURE) IMPLANT
SUT MNCRL AB 4-0 PS2 18 (SUTURE) ×3 IMPLANT
SUT SILK 2 0 SH (SUTURE) ×3 IMPLANT
SUT VIC AB 2-0 CT1 27 (SUTURE)
SUT VIC AB 2-0 CT1 TAPERPNT 27 (SUTURE) IMPLANT
SUT VIC AB 3-0 SH 27 (SUTURE)
SUT VIC AB 3-0 SH 27X BRD (SUTURE) IMPLANT
SUT VICRYL 3-0 CR8 SH (SUTURE) ×3 IMPLANT
SYR 10ML LL (SYRINGE) ×3 IMPLANT
TOWEL OR 17X24 6PK STRL BLUE (TOWEL DISPOSABLE) ×3 IMPLANT
TOWEL OR NON WOVEN STRL DISP B (DISPOSABLE) ×3 IMPLANT
TUBE CONNECTING 20'X1/4 (TUBING) ×1
TUBE CONNECTING 20X1/4 (TUBING) ×2 IMPLANT
YANKAUER SUCT BULB TIP NO VENT (SUCTIONS) ×3 IMPLANT

## 2016-11-30 NOTE — Transfer of Care (Signed)
Immediate Anesthesia Transfer of Care Note  Patient: Wendy [name]  Procedure(s) Performed: Procedure(s): BREAST LUMPECTOMY WITH RADIOACTIVE SEED LOCALIZATION (Left)  Patient Location: PACU  Anesthesia Type:General  Level of Consciousness: awake, alert  and oriented  Airway & Oxygen Therapy: Patient Spontanous Breathing and Patient connected to face mask oxygen  Post-op Assessment: Report given to RN and Post -op Vital signs reviewed and stable  Post vital signs: Reviewed and stable  Last Vitals:  Vitals:   11/30/16 1107  BP: 117/80  Pulse: 73  Temp: 37.1 C    Last Pain:  Vitals:   11/30/16 1107  TempSrc: Oral      Patients Stated Pain Goal: 3 (39/68/86 4847)  Complications: No apparent anesthesia complications

## 2016-11-30 NOTE — Op Note (Addendum)
Patient Name:           Wendy [name]   Date of Surgery:        11/30/2016  Pre op Diagnosis:      Papilloma left breast  Post op Diagnosis:    Same  Procedure:                 Left breast lumpectomy with radioactive seed localization and margin assessment  Surgeon:                     Edsel Petrin. Dalbert Batman, M.D., FACS  Assistant:                      OR staff   Indication for Assistant: n/a  Operative Indications:  This is a very pleasant 46 year old [place] American female. She is referred by Dr. Johnnette Gourd for excision of a papilloma of the left breast, 3 o'clock position.      The patient has a history of a right breast excision about 10 years ago. She thinks this was a cyst and  the wound may have been infected. She also has a history of a left breast excision upper outer quadrant for fibroadenoma 2011.       She recently felt bilateral lumpy areas. She presented for imaging studies at [facility]. They found a benign appearing 1.5 cm cyst on the right at the 1 o'clock position, 2 cm from the nipple. They found a 9 mm solid mass in the left breast at the 3 o'clock position, 2 cm from the nipple. There are multiple cysts in the upper outer left breast Ultrasound of the left axilla was negative     Image guided biopsy of the 9 mm mass left breast 3 o'clock position revealed sclerosing papillary lesion with florid usual ductal hyperplasia and other scarring phenomenon but no atypia. She is had a tiny hematoma but not bad.      Past history significant for fibroadenoma left breast excised 2011. Right breast cyst excised 2008. GERD, chronic. Family history reveals maternal aunt died of breast cancer in her 55s. Maternal grandmother died of ovarian cancer. Maternal uncle died of prostate cancer .      We had a long talk. She is very anxious about her diagnosis. Even asked if she needed a mastectomy which I told her she did not. I advised conservative excision of this area and  review of pathology and treatment accordingly I told her we would consider referral to the high risk clinic because of her multiple biopsies and family history but that I would like to get this histology documented first. She understands and completely agrees.  she'll be scheduled for left breast lumpectomy with radioactive seed localization. She agrees with this plan.  Operative Findings:       The original biopsy marker clip and radioactive seed were found relatively superficially in the left breast laterally at the 3:00 position.  There was no gross palpable abnormality.  The specimen mammogram looked good with the biopsy marker clip and radioactive seed close to one another in the center of the specimen.  Procedure in Detail:          Following the induction of general LMA anesthesia the patient's left breast was prepped and draped in a sterile fashion.  Surgical timeout was performed.  Intravenous antibiotics were given.  1% Xylocaine with epinephrine was used as a local infiltration anesthetic.  After identifying the area of maximum radioactivity I chose to make a circumareolar incision at the margin of the areola laterally.  Using the neoprobe conservative lumpectomy was performed.  The specimen was removed and marked with silk sutures and a 6 color  Ink kit to orient the pathologist.  Specimen mammogram looked good as described above.  The specimen was marked and sent to the lab.  Hemostasis was excellent.  The wound was irrigated.  I placed 5 metal clips in the margins of the lumpectomy cavity according to our protocol.  The breast tissues were reapproximated with 3-0 Vicryl sutures and the skin closed with a running subcuticular 4-0 Monocryl and Dermabond.  Breast binder was placed.  The patient tolerated the procedure well was taken to PACU in stable condition.  EBL less than 20 mL.  Counts correct.  Complications none.     Edsel Petrin. Dalbert Batman, M.D., FACS General and Minimally Invasive  Surgery Breast and Colorectal Surgery   Addendum: The patient was given a prescription for Norco for pain.  I locked onto [email or web address] website and reviewed her prescription medication history.    11/30/2016 12:39 PM

## 2016-11-30 NOTE — Anesthesia Procedure Notes (Signed)
Procedure Name: LMA Insertion Date/Time: 11/30/2016 11:44 AM Performed by: Melynda Ripple D Pre-anesthesia Checklist: Patient identified, Emergency Drugs available, Suction available and Patient being monitored Patient Re-evaluated:Patient Re-evaluated prior to inductionOxygen Delivery Method: Circle system utilized Preoxygenation: Pre-oxygenation with 100% oxygen Intubation Type: IV induction Ventilation: Mask ventilation without difficulty LMA: LMA inserted LMA Size: 3.0 Number of attempts: 1 Airway Equipment and Method: Bite block Placement Confirmation: positive ETCO2 Tube secured with: Tape Dental Injury: Teeth and Oropharynx as per pre-operative assessment

## 2016-11-30 NOTE — Discharge Instructions (Signed)
[facility] °Office Phone Number [phone] ° °BREAST BIOPSY/ PARTIAL MASTECTOMY: POST OP INSTRUCTIONS ° °Always review your discharge instruction sheet given to you by the facility where your surgery was performed. ° °IF YOU HAVE DISABILITY OR FAMILY LEAVE FORMS, YOU MUST BRING THEM TO THE OFFICE FOR PROCESSING.  DO NOT GIVE THEM TO YOUR DOCTOR. ° °1. A prescription for pain medication may be given to you upon discharge.  Take your pain medication as prescribed, if needed.  If narcotic pain medicine is not needed, then you may take acetaminophen (Tylenol) or ibuprofen (Advil) as needed. °2. Take your usually prescribed medications unless otherwise directed °3. If you need a refill on your pain medication, please contact your pharmacy.  They will contact our office to request authorization.  Prescriptions will not be filled after 5pm or on week-ends. °4. You should eat very light the first 24 hours after surgery, such as soup, crackers, pudding, etc.  Resume your normal diet the day after surgery. °5. Most patients will experience some swelling and bruising in the breast.  Ice packs and a good support bra will help.  Swelling and bruising can take several days to resolve.  °6. It is common to experience some constipation if taking pain medication after surgery.  Increasing fluid intake and taking a stool softener will usually help or prevent this problem from occurring.  A mild laxative (Milk of Magnesia or Miralax) should be taken according to package directions if there are no bowel movements after 48 hours. °7. Unless discharge instructions indicate otherwise, you may remove your bandages 24-48 hours after surgery, and you may shower at that time.  You may have steri-strips (small skin tapes) in place directly over the incision.  These strips should be left on the skin for 7-10 days.  If your surgeon used skin glue on the incision, you may shower in 24 hours.  The glue will flake off over the  next 2-3 weeks.  Any sutures or staples will be removed at the office during your follow-up visit. °8. ACTIVITIES:  You may resume regular daily activities (gradually increasing) beginning the next day.  Wearing a good support bra or sports bra minimizes pain and swelling.  You may have sexual intercourse when it is comfortable. °a. You may drive when you no longer are taking prescription pain medication, you can comfortably wear a seatbelt, and you can safely maneuver your car and apply brakes. °b. RETURN TO WORK:  ______________________________________________________________________________________ °9. You should see your doctor in the office for a follow-up appointment approximately two weeks after your surgery.  Your doctor’s nurse will typically make your follow-up appointment when she calls you with your pathology report.  Expect your pathology report 2-3 business days after your surgery.  You may call to check if you do not hear from us after three days. °10. OTHER INSTRUCTIONS: _______________________________________________________________________________________________ _____________________________________________________________________________________________________________________________________ °_____________________________________________________________________________________________________________________________________ °_____________________________________________________________________________________________________________________________________ ° °WHEN TO CALL YOUR DOCTOR: °1. Fever over 101.0 °2. Nausea and/or vomiting. °3. Extreme swelling or bruising. °4. Continued bleeding from incision. °5. Increased pain, redness, or drainage from the incision. ° °The clinic staff is available to answer your questions during regular business hours.  Please don’t hesitate to call and ask to speak to one of the nurses for clinical concerns.  If you have a medical emergency, go to the nearest  emergency room or call 911.  A surgeon from [facility] is always on call at the hospital. ° °For further questions, please visit [email or web address]  ° ° ° ° °  Post Anesthesia Home Care Instructions ° °Activity: °Get plenty of rest for the remainder of the day. A responsible individual must stay with you for 24 hours following the procedure.  °For the next 24 hours, DO NOT: °-Drive a car °-Operate machinery °-Drink alcoholic beverages °-Take any medication unless instructed by your physician °-Make any legal decisions or sign important papers. ° °Meals: °Start with liquid foods such as gelatin or soup. Progress to regular foods as tolerated. Avoid greasy, spicy, heavy foods. If nausea and/or vomiting occur, drink only clear liquids until the nausea and/or vomiting subsides. Call your physician if vomiting continues. ° °Special Instructions/Symptoms: °Your throat may feel dry or sore from the anesthesia or the breathing tube placed in your throat during surgery. If this causes discomfort, gargle with warm salt water. The discomfort should disappear within 24 hours. ° °If you had a scopolamine patch placed behind your ear for the management of post- operative nausea and/or vomiting: ° °1. The medication in the patch is effective for 72 hours, after which it should be removed.  Wrap patch in a tissue and discard in the trash. Wash hands thoroughly with soap and water. °2. You may remove the patch earlier than 72 hours if you experience unpleasant side effects which may include dry mouth, dizziness or visual disturbances. °3. Avoid touching the patch. Wash your hands with soap and water after contact with the patch. °  ° °

## 2016-11-30 NOTE — Interval H&P Note (Signed)
History and Physical Interval Note:  11/30/2016 11:34 AM  Wendy [name]  has presented today for surgery, with the diagnosis of LEFT BREAST PAPILLOMA  The various methods of treatment have been discussed with the patient and family. After consideration of risks, benefits and other options for treatment, the patient has consented to  Procedure(s): BREAST LUMPECTOMY WITH RADIOACTIVE SEED LOCALIZATION (Left) as a surgical intervention .  The patient's history has been reviewed, patient examined, no change in status, stable for surgery.  I have reviewed the patient's chart and labs.  Questions were answered to the patient's satisfaction.     Adin Hector

## 2016-11-30 NOTE — Anesthesia Postprocedure Evaluation (Addendum)
Anesthesia Post Note  Patient: Wendy [name]  Procedure(s) Performed: Procedure(s) (LRB): BREAST LUMPECTOMY WITH RADIOACTIVE SEED LOCALIZATION (Left)  Patient location during evaluation: PACU Anesthesia Type: General Level of consciousness: awake and alert Pain management: pain level controlled Vital Signs Assessment: post-procedure vital signs reviewed and stable Respiratory status: spontaneous breathing, nonlabored ventilation, respiratory function stable and patient connected to nasal cannula oxygen Cardiovascular status: blood pressure returned to baseline and stable Postop Assessment: no signs of nausea or vomiting Anesthetic complications: no       Last Vitals:  Vitals:   11/30/16 1330 11/30/16 1400  BP: 123/87 121/86  Pulse: 76 68  Resp: 11 18  Temp:  36.6 C    Last Pain:  Vitals:   11/30/16 1400  TempSrc:   PainSc: 2                  Akeelah Seppala

## 2016-11-30 NOTE — Anesthesia Preprocedure Evaluation (Signed)
Anesthesia Evaluation  Patient identified by MRN, date of birth, ID band Patient awake    Reviewed: Allergy & Precautions, NPO status , Patient's Chart, lab work & pertinent test results  Airway Mallampati: II  TM Distance: >3 FB Neck ROM: Full    Dental  (+) Teeth Intact   Pulmonary neg pulmonary ROS,    breath sounds clear to auscultation       Cardiovascular negative cardio ROS   Rhythm:Regular     Neuro/Psych negative neurological ROS  negative psych ROS   GI/Hepatic Neg liver ROS, GERD  Medicated and Controlled,  Endo/Other  negative endocrine ROS  Renal/GU negative Renal ROS  negative genitourinary   Musculoskeletal  (+) Arthritis ,   Abdominal   Peds negative pediatric ROS (+)  Hematology negative hematology ROS (+)   Anesthesia Other Findings   Reproductive/Obstetrics negative OB ROS                             Anesthesia Physical Anesthesia Plan  ASA: II  Anesthesia Plan: General   Post-op Pain Management:    Induction: Intravenous  Airway Management Planned: LMA  Additional Equipment: None  Intra-op Plan:   Post-operative Plan: Extubation in OR  Informed Consent: I have reviewed the patients History and Physical, chart, labs and discussed the procedure including the risks, benefits and alternatives for the proposed anesthesia with the patient or authorized representative who has indicated his/her understanding and acceptance.   Dental advisory given  Plan Discussed with: CRNA and Surgeon  Anesthesia Plan Comments:         Anesthesia Quick Evaluation

## 2016-12-03 ENCOUNTER — Encounter (HOSPITAL_BASED_OUTPATIENT_CLINIC_OR_DEPARTMENT_OTHER): Payer: Self-pay | Admitting: General Surgery

## 2016-12-03 NOTE — Progress Notes (Signed)
Inform patient of Pathology report,. Tell her that her left breast lumpectomy showed a benign radial scar and a papilloma.  There was no malignancy or atypia.  This is very good news.  I will discuss with her in detail at her next office visit.  hmi

## 2016-12-12 ENCOUNTER — Ambulatory Visit (HOSPITAL_COMMUNITY): Admission: EM | Admit: 2016-12-12 | Discharge: 2016-12-12 | Payer: BLUE CROSS/BLUE SHIELD | Source: Home / Self Care

## 2016-12-12 ENCOUNTER — Emergency Department (HOSPITAL_COMMUNITY)
Admission: EM | Admit: 2016-12-12 | Discharge: 2016-12-12 | Disposition: A | Discharge status: Home / Self Care | Payer: BLUE CROSS/BLUE SHIELD | Attending: Emergency Medicine | Admitting: Emergency Medicine

## 2016-12-12 ENCOUNTER — Encounter (HOSPITAL_COMMUNITY): Payer: Self-pay | Admitting: Emergency Medicine

## 2016-12-12 ENCOUNTER — Emergency Department (HOSPITAL_COMMUNITY): Payer: BLUE CROSS/BLUE SHIELD

## 2016-12-12 ENCOUNTER — Other Ambulatory Visit: Payer: Self-pay

## 2016-12-12 DIAGNOSIS — Z79899 Other long term (current) drug therapy: Secondary | ICD-10-CM | POA: Insufficient documentation

## 2016-12-12 DIAGNOSIS — R079 Chest pain, unspecified: Secondary | ICD-10-CM | POA: Diagnosis not present

## 2016-12-12 LAB — CBC WITH DIFFERENTIAL/PLATELET
BASOS ABS: 0 10*3/uL (ref 0.0–0.1)
Basophils Relative: 0 %
Eosinophils Absolute: 0.1 10*3/uL (ref 0.0–0.7)
Eosinophils Relative: 1 %
HEMATOCRIT: 37.4 % (ref 36.0–46.0)
Hemoglobin: 12.1 g/dL (ref 12.0–15.0)
LYMPHS PCT: 48 %
Lymphs Abs: 2.2 10*3/uL (ref 0.7–4.0)
MCH: 27.3 pg (ref 26.0–34.0)
MCHC: 32.4 g/dL (ref 30.0–36.0)
MCV: 84.4 fL (ref 78.0–100.0)
Monocytes Absolute: 0.3 10*3/uL (ref 0.1–1.0)
Monocytes Relative: 7 %
NEUTROS ABS: 2 10*3/uL (ref 1.7–7.7)
NEUTROS PCT: 44 %
PLATELETS: 353 10*3/uL (ref 150–400)
RBC: 4.43 MIL/uL (ref 3.87–5.11)
RDW: 13.1 % (ref 11.5–15.5)
WBC: 4.6 10*3/uL (ref 4.0–10.5)

## 2016-12-12 LAB — COMPREHENSIVE METABOLIC PANEL
ALT: 14 U/L (ref 14–54)
AST: 20 U/L (ref 15–41)
Albumin: 4.2 g/dL (ref 3.5–5.0)
Alkaline Phosphatase: 46 U/L (ref 38–126)
Anion gap: 9 (ref 5–15)
BUN: 8 mg/dL (ref 6–20)
CHLORIDE: 108 mmol/L (ref 101–111)
CO2: 22 mmol/L (ref 22–32)
CREATININE: 1.09 mg/dL — AB (ref 0.44–1.00)
Calcium: 9 mg/dL (ref 8.9–10.3)
GFR calc Af Amer: >60 mL/min (ref >60)
GFR, EST NON AFRICAN AMERICAN: 59 mL/min — AB (ref >60)
GLUCOSE: 86 mg/dL (ref 65–99)
Potassium: 3.5 mmol/L (ref 3.5–5.1)
Sodium: 139 mmol/L (ref 135–145)
Total Bilirubin: 0.6 mg/dL (ref 0.3–1.2)
Total Protein: 7.3 g/dL (ref 6.5–8.1)

## 2016-12-12 LAB — TROPONIN I

## 2016-12-12 LAB — D-DIMER, QUANTITATIVE: D-Dimer, Quant: 0.72 ug/mL-FEU — ABNORMAL HIGH (ref 0.00–0.50)

## 2016-12-12 MED ORDER — IOPAMIDOL (ISOVUE-370) INJECTION 76%
INTRAVENOUS | Status: AC
  Administered 2016-12-12: 100 mL
  Filled 2016-12-12: qty 100

## 2016-12-12 NOTE — ED Provider Notes (Addendum)
I saw and evaluated the patient, reviewed the resident's note and I agree with the findings and plan.   EKG Interpretation  Date/Time:  Wednesday December 12 2016 15:13:58 EDT Ventricular Rate:  76 PR Interval:  148 QRS Duration: 72 QT Interval:  398 QTC Calculation: 447 R Axis:   57 Text Interpretation:  Normal sinus rhythm with sinus arrhythmia Normal ECG Similar to prior EKG  Confirmed by Dominie Benedick MD, Jaslene Marsteller [phone]) on 12/12/2016 4:04:29 PM      I have independently reviewed the following tracings and/or images and used them in my medical decision making: CXR  46 year old female who presents with chest pain, intermittent x 2 weeks. Sharp in nature that comes and goes. Most noticeable when she moves, raises her voice, and ambulates. Notices that she is breathing heavier when going up the stairs. Recent lumpectomy 1 week ago over left breast w/ left breast pain, but states pain is different. Her biopsy results were benign. No leg swelling or calf tenderness.  Well appearing. Vitals stable. Chest pain not reproduced. She is low risk ACS without any major risk factors other than her age. Will obtain ddimer to rule out PE given recent surgical procedure; although felt unlikely as symptoms began before prior to her lumpectomy. CXR visualized and shows no acute cardiopulmonary processes. If negative serial troponins and ruled out for PE, felt to be appropriate for discharge for outpatient management.      Forde Dandy, MD 12/12/16 [facility] Myli Pae, MD 12/12/16 [phone]

## 2016-12-12 NOTE — ED Notes (Signed)
Lumpectomy on Friday.  Reports sob prior to procedure, but worsening sob since procedure.  Reports pain in chest, sharp, stabbing and doe.  Patient has been evaluated by lawrence, np at triage and recommended patient go to ed for further evaluation

## 2016-12-12 NOTE — ED Provider Notes (Signed)
[facility] DEPT Provider Note   CSN: [id number] Arrival date & time: 12/12/16  1506     History   Chief Complaint Chief Complaint  Patient presents with  . Chest Pain  . Shortness of Breath    HPI Wendy [name] is a 46 y.o. female.  The history is provided by the patient and medical records.  Chest Pain   This is a new problem. The current episode started more than 1 week ago. Episode frequency: intermittently. The pain is associated with rest. Pain location: left parasternal & across the top of her chest. The pain is moderate. The quality of the pain is described as stabbing. The pain does not radiate. The symptoms are aggravated by exertion. Associated symptoms include shortness of breath (on exertion). Pertinent negatives include no abdominal pain, no back pain, no cough, no fever, no palpitations and no vomiting. Associated symptoms comments: DOE. She has tried rest for the symptoms. The treatment provided significant relief. There are no known risk factors.  Her past medical history is significant for hyperlipidemia (recently diagnosed).  Pertinent negatives for past medical history include no seizures.    Past Medical History:  Diagnosis Date  . Arthritis    feet  . Degenerative disc disease    lumbar  . Dysphagia   . GERD (gastroesophageal reflux disease)   . History of MRSA infection    leg  . Irritable bowel syndrome (IBS)   . 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 Breast lump on left side at 3 o'clock position 07/25/2012  . 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 BREAST BIOPSY  08/22/2011   Procedure: BREAST BIOPSY;  Surgeon: Harl Bowie, MD;  Location: [facility];  Service: General;  Laterality: Left;  excision left breast mass  . BREAST LUMPECTOMY WITH RADIOACTIVE SEED LOCALIZATION Left 11/30/2016   Procedure: BREAST LUMPECTOMY WITH RADIOACTIVE SEED  LOCALIZATION;  Surgeon: Fanny Skates, MD;  Location: [facility];  Service: General;  Laterality: Left;  . BUNIONECTOMY Bilateral   . DIAGNOSTIC LAPAROSCOPY  11/22/2008   biopsy of bilateral uterosacral ligaments and lysis of adhesions  . DILATION AND CURETTAGE OF UTERUS  07/06/2004  . HYSTEROSCOPY WITH NOVASURE  07/06/2004  . TONSILLECTOMY AND ADENOIDECTOMY    . TUBAL LIGATION      OB History    Gravida Para Term Preterm AB Living   3 2 2   1 2    SAB TAB Ectopic Multiple Live Births   1               Home Medications    Prior to Admission medications   Medication Sig Start Date End Date Taking? Authorizing Provider  HYDROcodone-acetaminophen (NORCO) 5-325 MG tablet Take 1-2 tablets by mouth every 6 (six) hours as needed for moderate pain or severe pain. 11/30/16   Fanny Skates, MD  ibuprofen (ADVIL,MOTRIN) 200 MG tablet Take 200 mg by mouth every 6 (six) hours as needed.    Historical Provider, MD  omeprazole (PRILOSEC) 40 MG capsule Take 40 mg by mouth daily.    Historical Provider, MD  Vitamin D, Ergocalciferol, (DRISDOL) 50000 units CAPS capsule Take 50,000 Units by mouth 2 (two) times a week.    Historical Provider, MD    Family History Family History  Problem Relation Age of Onset  . Cancer Mother     kidney  .  Cancer Maternal Aunt     breast, lung  . Cancer Maternal Uncle     lung, stomach  . Cancer Maternal Grandmother     ovarian    Social History Social History  Substance Use Topics  . Smoking status: Never Smoker  . Smokeless tobacco: Never Used  . Alcohol use Yes     Comment: occasionally     Allergies   Morphine and related   Review of Systems Review of Systems  Constitutional: Negative for chills and fever.  HENT: Negative for ear pain and sore throat.   Eyes: Negative for pain and visual disturbance.  Respiratory: Positive for shortness of breath (on exertion). Negative for cough.   Cardiovascular: Positive for chest pain.  Negative for palpitations.  Gastrointestinal: Negative for abdominal pain and vomiting.  Genitourinary: Negative for dysuria and hematuria.  Musculoskeletal: Negative for arthralgias and back pain.  Skin: Negative for color change and rash.  Neurological: Negative for seizures and syncope.  All other systems reviewed and are negative.    Physical Exam Updated Vital Signs BP (!) 148/98 (BP Location: Left Arm)   Pulse 71   Temp 98.1 F (36.7 C) (Oral)   Resp 18   Ht 5\' 5"  (1.651 m)   Wt 69.9 kg   SpO2 100%   BMI 25.63 kg/m   Physical Exam  Constitutional: She is oriented to person, place, and time. She appears well-developed and well-nourished. No distress.  HENT:  Head: Normocephalic and atraumatic.  Eyes: Conjunctivae are normal.  Neck: Neck supple.  Cardiovascular: Normal rate and regular rhythm.   No murmur heard. Pulmonary/Chest: Effort normal and breath sounds normal. No respiratory distress.  Abdominal: Soft. There is no tenderness.  Musculoskeletal: She exhibits no edema.  Neurological: She is alert and oriented to person, place, and time.  Skin: Skin is warm and dry.  Psychiatric: She has a normal mood and affect.  Nursing note and vitals reviewed.    ED Treatments / Results  Labs (all labs ordered are listed, but only abnormal results are displayed) Labs Reviewed  CBC WITH DIFFERENTIAL/PLATELET  COMPREHENSIVE METABOLIC PANEL  TROPONIN I    EKG  EKG Interpretation None       Radiology No results found.  Procedures Procedures (including critical care time)  Medications Ordered in ED Medications - No data to display   Initial Impression / Assessment and Plan / ED Course  I have reviewed the triage vital signs and the nursing notes.  Pertinent labs & imaging results that were available during my care of the patient were reviewed by me and considered in my medical decision making (see chart for details).    Pt with h/o recent lumpectomy  (negative for malignancy) presents with CP. Pt says she's been having intermittent, stabbing chest pains for the last 2wks; she localizes the pain to the left parasternal area w/occassional pain across the top of her chest w/o radiation. She says the pain can be brought about by activity & is associated with DOE. Denies F/C, diaphoresis, HA, lightheadedness, SOB at rest, pleuritic pain, leg swelling, N/V, recent illness, sick contacts, hormonal birth control, recent travel or pain near her surgical site. Recently told by her PCP that her cholesterol was elevated, but has no other cardiac risk factors.  VS & exam as above. EKG: NSR @ 76bpm w/o signs of ischemia. HEAR score low. Low risk per Wells'. CXR WNL. Labs remarkable for d-dimer 0.72, so CTA ordered to rule out PE.  CTA  w/o evidence of PE or cardiopulmonary disease. Cause of the Pt's symptoms unclear, but doubt emergent etiology given history, exam, and workup.  Explained all results to the Pt. Will discharge the Pt home . Recommending follow-up with PCP. ED return precautions provided. Pt acknowledged understanding of, and concurrence with the plan. All questions answered to her satisfaction. In stable condition at the time of discharge.  Final Clinical Impressions(s) / ED Diagnoses   Final diagnoses:  Chest pain, unspecified type    New Prescriptions New Prescriptions   No medications on file     Jenny Reichmann, MD 12/12/16 [facility] Liu, MD 12/13/16 1013

## 2016-12-12 NOTE — ED Triage Notes (Addendum)
Pt st's she has been having mid chest pain off and on x's 2 weeks.  Describes pain as a stabbing type pain.  Also st's she gets short of breath when walking up steps.  Pt st's she had a lumpectomy from left breast 1 week ago

## 2016-12-25 ENCOUNTER — Telehealth: Payer: Self-pay | Admitting: *Deleted

## 2016-12-25 NOTE — Telephone Encounter (Signed)
NOTES SENT TO SCHEDULING.  °

## 2016-12-31 ENCOUNTER — Encounter: Payer: Self-pay | Admitting: Genetic Counselor

## 2016-12-31 ENCOUNTER — Telehealth: Payer: Self-pay | Admitting: Genetic Counselor

## 2016-12-31 NOTE — Telephone Encounter (Signed)
Pt cld to schedule the pt an appt. Appt has been scheduled for the pt to see [name] on 6/6 at 2pm. Pt aware to arrive 15 minutes early. Demographics verified. Letter mailed to the pt.

## 2017-01-01 ENCOUNTER — Encounter: Payer: Self-pay | Admitting: Cardiology

## 2017-01-01 ENCOUNTER — Ambulatory Visit (INDEPENDENT_AMBULATORY_CARE_PROVIDER_SITE_OTHER): Payer: BLUE CROSS/BLUE SHIELD | Admitting: Cardiology

## 2017-01-01 VITALS — BP 128/98 | HR 76 | Ht 65.5 in | Wt 155.8 lb

## 2017-01-01 DIAGNOSIS — R0789 Other chest pain: Secondary | ICD-10-CM | POA: Diagnosis not present

## 2017-01-01 NOTE — Progress Notes (Signed)
01/01/2017 [name]   10-Jul-1971  [id number]  Primary Physician Patient, No Pcp Per Primary Cardiologist: New   Reason for Visit/CC: New Patient Evaluation for Chest Pain   HPI:  Wendy [name] is a 46 y.o. female who is being seen today, as a new patient, for the evaluation of chest pain at the request of Everett Graff, MD.   She has no cardiac risk factors other than HLD. She reports that this was recently diagnosed. She was told that her LDL was elevated recently, however she is not currently on medications. She denies any h/o DM, HTN, tobacco use nor family history. She has a recent history of a left breast mass. She underwent implantation of a radioactive seed on 11/28/16. This was removed + lumpectomy on 11/30/16. She has GERD on PPI therapy with Prilosec but no other history.   She was seen in the [facility] ED on 12/11/16 for evaluation of chest pain. Symptoms have been occurring off and on for the last month but worsened after her lumpectomy. She describes sharp shooting/stabing pain across the anterior chest with occasional radiation to her neck, jaw and right arm. Last several seconds at a time. She does not believe it is gas pain, as it is unlike her GI symptoms/ GERD. Not any worse with deep breathing nor palpation of chest wall. Non exertional, however she has noticed recent exertional dyspnea walking up stairs. No exertional dyspnea walking on flat surfaces. No associated palpitations, n/v or diaphoresis.   In the ED, EKG was nonischemic. Troponin negative. CXR w/o acute disease. D-dimer was elevated, however chest CT was negative for PE. No coronary calcifications noted. CBC and BMP both unremarkable. She followed up with her PCP who advised cardiac consultation.  She is currently CP free. EKG today shows NSR. No ischemia. VSS. Physical exam is benign.   Current Meds  Medication Sig  . dicyclomine (BENTYL) 20 MG tablet Take 40 mg by mouth daily as needed. Stomach cramping  .  omeprazole (PRILOSEC) 40 MG capsule Take 40 mg by mouth daily.   Allergies  Allergen Reactions  . Morphine And Related Itching and Rash   Past Medical History:  Diagnosis Date  . Arthritis    feet  . Degenerative disc disease    lumbar  . Dysphagia   . GERD (gastroesophageal reflux disease)   . History of MRSA infection    leg  . Irritable bowel syndrome (IBS)   . 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 Cancer Mother        kidney  . Cancer Maternal Aunt        breast, lung  . Cancer Maternal Uncle        lung, stomach  . Cancer Maternal Grandmother        ovarian   Past Surgical History:  Procedure Laterality Date  . BREAST BIOPSY  08/22/2011   Procedure: BREAST BIOPSY;  Surgeon: Harl Bowie, MD;  Location: [facility];  Service: General;  Laterality: Left;  excision left breast mass  . BREAST LUMPECTOMY WITH RADIOACTIVE SEED LOCALIZATION Left 11/30/2016   Procedure: BREAST LUMPECTOMY WITH RADIOACTIVE SEED LOCALIZATION;  Surgeon: Fanny Skates, MD;  Location: [facility];  Service: General;  Laterality: Left;  . BUNIONECTOMY Bilateral   . DIAGNOSTIC LAPAROSCOPY  11/22/2008   biopsy of bilateral uterosacral ligaments and lysis of adhesions  . DILATION AND CURETTAGE OF UTERUS  07/06/2004  .  HYSTEROSCOPY WITH NOVASURE  07/06/2004  . TONSILLECTOMY AND ADENOIDECTOMY    . TUBAL LIGATION     Social History   Social History  . Marital status: Divorced    Spouse name: N/A  . Number of children: 2  . Years of education: College   Occupational History  .  [employer]   Social History Main Topics  . Smoking status: Never Smoker  . Smokeless tobacco: Never Used  . Alcohol use Yes     Comment: occasionally  . Drug use: No  . Sexual activity: Yes    Birth control/ protection: Condom   Other Topics Concern  . Not on file   Social History Narrative   Patient lives at home with daughter.   Caffeine  Use: 1.5 cups daily.     Review of Systems: General: negative for chills, fever, night sweats or weight changes.  Cardiovascular: negative for chest pain, dyspnea on exertion, edema, orthopnea, palpitations, paroxysmal nocturnal dyspnea or shortness of breath Dermatological: negative for rash Respiratory: negative for cough or wheezing Urologic: negative for hematuria Abdominal: negative for nausea, vomiting, diarrhea, bright red blood per rectum, melena, or hematemesis Neurologic: negative for visual changes, syncope, or dizziness All other systems reviewed and are otherwise negative except as noted above.   Physical Exam:  Blood pressure (!) 128/98, pulse 76, height 5' 5.5" (1.664 m), weight 155 lb 12.8 oz (70.7 kg).  General appearance: alert, cooperative and no distress Neck: no carotid bruit, no JVD and thyroid not enlarged, symmetric, no tenderness/mass/nodules Lungs: clear to auscultation bilaterally Heart: regular rate and rhythm, S1, S2 normal, no murmur, click, rub or gallop Extremities: extremities normal, atraumatic, no cyanosis or edema Pulses: 2+ and symmetric Skin: Skin color, texture, turgor normal. No rashes or lesions Neurologic: Grossly normal  EKG NSR. 75 bpm, no ischemia -- personally reviewed   ASSESSMENT AND PLAN:   1. Atypical Chest Pain: Pain is atypical. Sharp stabbing pain that is not exacerbated nor triggered by activity, however she has had recent exertional dyspnea walking up stairs, which appears to be new. Her only cardiac risk factor includes newly diagnosed HLD. EKG is nonischemic and physical exam is benign. Recent w/u in the ED included negative troponin and chest CT that was negative for PE. I've discussed patient case with Dr. Curt Bears, DOD. We will arrange for an exercise tolerance test to r/o ischemia. If normal stress test, we will advise she f/u with her PCP and can f/u PRN. If abnormal stress test, we will bring patient back to clinic to discuss  further w/u.   [name], MHS [facility] 01/01/2017 12:52 PM

## 2017-01-01 NOTE — Patient Instructions (Signed)
Medication Instructions:  Your physician recommends that you continue on your current medications as directed. Please refer to the Current Medication list given to you today.   Labwork: NONE ORDERED  Testing/Procedures: Your physician has requested that you have an exercise tolerance test. For further information please visit [email or web address]. Please also follow instruction sheet, as given.    Follow-Up: AS NEEDED WITH DR. Meda Coffee PENDING STRESS TEST RESULTS.   Any Other Special Instructions Will Be Listed Below (If Applicable).     If you need a refill on your cardiac medications before your next appointment, please call your pharmacy.

## 2017-01-08 ENCOUNTER — Ambulatory Visit (INDEPENDENT_AMBULATORY_CARE_PROVIDER_SITE_OTHER): Payer: BLUE CROSS/BLUE SHIELD

## 2017-01-08 DIAGNOSIS — R0789 Other chest pain: Secondary | ICD-10-CM

## 2017-01-08 LAB — EXERCISE TOLERANCE TEST
CHL CUP MPHR: 172 {beats}/min
CHL CUP RESTING HR STRESS: 79 {beats}/min
CHL RATE OF PERCEIVED EXERTION: 16
CSEPEDS: 0 s
CSEPHR: 93 %
CSEPPHR: 160 {beats}/min
Estimated workload: 10.1 METS
Exercise duration (min): 9 min

## 2017-01-21 NOTE — Addendum Note (Signed)
Addendum  created 01/21/17 1353 by Oleta Mouse, MD   Sign clinical note

## 2017-01-23 ENCOUNTER — Encounter: Payer: Self-pay | Admitting: Genetic Counselor

## 2017-01-23 ENCOUNTER — Ambulatory Visit (HOSPITAL_BASED_OUTPATIENT_CLINIC_OR_DEPARTMENT_OTHER): Payer: BLUE CROSS/BLUE SHIELD | Admitting: Genetic Counselor

## 2017-01-23 ENCOUNTER — Other Ambulatory Visit: Payer: BLUE CROSS/BLUE SHIELD

## 2017-01-23 DIAGNOSIS — D249 Benign neoplasm of unspecified breast: Secondary | ICD-10-CM

## 2017-01-23 DIAGNOSIS — Z8051 Family history of malignant neoplasm of kidney: Secondary | ICD-10-CM | POA: Diagnosis not present

## 2017-01-23 DIAGNOSIS — Z7183 Encounter for nonprocreative genetic counseling: Secondary | ICD-10-CM | POA: Diagnosis not present

## 2017-01-23 DIAGNOSIS — Z803 Family history of malignant neoplasm of breast: Secondary | ICD-10-CM

## 2017-01-23 DIAGNOSIS — Z8041 Family history of malignant neoplasm of ovary: Secondary | ICD-10-CM

## 2017-01-23 NOTE — Progress Notes (Signed)
REFERRING PROVIDER: Fanny Skates, MD [address], [place] [address]  PRIMARY PROVIDER:  Patient, No Pcp Per  PRIMARY REASON FOR VISIT:  1. Fibroadenoma of breast, unspecified laterality   2. Family history of breast cancer   3. Family history of ovarian cancer   4. Family history of kidney cancer      HISTORY OF PRESENT ILLNESS:   Wendy [name], a 46 y.o. female, was seen for a [facility] cancer genetics consultation at the request of Dr. Dalbert Batman due to a family history of cancer.  Wendy [name] presents to clinic today to discuss the possibility of a hereditary predisposition to cancer, genetic testing, and to further clarify her future cancer risks, as well as potential cancer risks for family members. Wendy [name] is a 46 y.o. female with no personal history of cancer.  She had a recent breast biopsy that found a papilloma.  She is concerned about her risk for cancer, based on her family history of cancer and personal history of multiple breast biopsies.  CANCER HISTORY:   No history exists.     HORMONAL RISK FACTORS:  Menarche was at age 89.  First live birth at age 30.  OCP use for approximately 3-4 years.  Ovaries intact: yes.  Hysterectomy: no.  Menopausal status: perimenopausal.  HRT use: 0 years. Colonoscopy: yes; normal. Mammogram within the last year: yes. Number of breast biopsies: 4-5. Up to date with pelvic exams:  yes. Any excessive radiation exposure in the past:  no  Past Medical History:  Diagnosis Date  . Arthritis    feet  . Degenerative disc disease    lumbar  . Dysphagia   . Family history of breast cancer   . Family history of kidney cancer   . Family history of ovarian cancer   . GERD (gastroesophageal reflux disease)   . History of MRSA infection    leg  . Irritable bowel syndrome (IBS)   . 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 BREAST BIOPSY  08/22/2011   Procedure: BREAST  BIOPSY;  Surgeon: Harl Bowie, MD;  Location: [facility];  Service: General;  Laterality: Left;  excision left breast mass  . BREAST LUMPECTOMY WITH RADIOACTIVE SEED LOCALIZATION Left 11/30/2016   Procedure: BREAST LUMPECTOMY WITH RADIOACTIVE SEED LOCALIZATION;  Surgeon: Fanny Skates, MD;  Location: [facility];  Service: General;  Laterality: Left;  . BUNIONECTOMY Bilateral   . DIAGNOSTIC LAPAROSCOPY  11/22/2008   biopsy of bilateral uterosacral ligaments and lysis of adhesions  . DILATION AND CURETTAGE OF UTERUS  07/06/2004  . HYSTEROSCOPY WITH NOVASURE  07/06/2004  . TONSILLECTOMY AND ADENOIDECTOMY    . TUBAL LIGATION      Social History   Social History  . Marital status: Divorced    Spouse name: N/A  . Number of children: 2  . Years of education: College   Occupational History  .  [employer]   Social History Main Topics  . Smoking status: Never Smoker  . Smokeless tobacco: Never Used  . Alcohol use Yes     Comment: occasionally  . Drug use: No  . Sexual activity: Yes    Birth control/ protection: Condom   Other Topics Concern  . None   Social History Narrative   Patient lives at home with daughter.   Caffeine Use: 1.5 cups daily.     FAMILY HISTORY:  We obtained a detailed,  4-generation family history.  Significant diagnoses are listed below: Family History  Problem Relation Age of Onset  . Cancer Mother        kidney  . Cancer Maternal Aunt        breast, lung  . Lung cancer Maternal Aunt   . Cancer Maternal Uncle        lung, stomach  . Cancer Maternal Grandmother        ovarian  . Prostate cancer Paternal Uncle   . Pancreatic cancer Maternal Grandfather   . Lung cancer Maternal Uncle   . Breast cancer Maternal Aunt        dx in her 20s  . Prostate cancer Maternal Uncle   . Ovarian cancer Cousin        dx in her 90s; maternal first cousin    The patient has a son and daughter who are cancer free.  She has two sisters  and a paternal half brother who are cancer free.  Her mother was diagnosed with kidney cancer at age 7.  She had 11 siblings.  A sister had breast cancer in her 1's, a brother and sister both had lung cancer, a brother had prostate cancer and another brother had stomach cancer.  The sister with lung cancer had a daughter who has ovarian cancer in her 41's.  The patient's maternal grandmother had ovarian cancer and her grandfather had pancreatic cancer.  The patient's father had a brother and two sisters.  His brother has prostate cancer.  The patient's paternal grandparents are deceased.   Wendy [name] is unaware of previous family history of genetic testing for hereditary cancer risks. Patient's maternal ancestors are of [place] and [place] descent, and paternal ancestors are of [place] American and Caucasian descent. There is no reported Ashkenazi Jewish ancestry. There is no known consanguinity.  GENETIC COUNSELING ASSESSMENT: Wendy [name] is a 46 y.o. female with a family history of cancer which is somewhat suggestive of a hereditary cancer syndrome and predisposition to cancer. We, therefore, discussed and recommended the following at today's visit.   DISCUSSION: We disucssed that about 5-10% of cancer is hereditary.  Most breast and ovarian cancer is due to BRCA mutations, but there are several cancers in the family that can be linked to Lynch syndrome.  We reviewed the characteristics, features and inheritance patterns of hereditary cancer syndromes. We also discussed genetic testing, including the appropriate family members to test, the process of testing, insurance coverage and turn-around-time for results. We discussed the implications of a negative, positive and/or variant of uncertain significant result. We recommended Wendy [name] pursue genetic testing for the Multi- gene panel. The Multi-Gene Panel offered by [facility] includes sequencing and/or deletion duplication testing  of the following 80 genes: ALK, APC, ATM, AXIN2,BAP1,  BARD1, BLM, BMPR1A, BRCA1, BRCA2, BRIP1, CASR, CDC73, CDH1, CDK4, CDKN1B, CDKN1C, CDKN2A (p14ARF), CDKN2A (p16INK4a), CEBPA, CHEK2, CTNNA1, DICER1, DIS3L2, EGFR (c.2369C>T, p.Thr790Met variant only), EPCAM (Deletion/duplication testing only), FH, FLCN, GATA2, GPC3, GREM1 (Promoter region deletion/duplication testing only), HOXB13 (c.251G>A, p.Gly84Glu), HRAS, KIT, MAX, MEN1, MET, MITF (c.952G>A, p.Glu318Lys variant only), MLH1, MSH2, MSH3, MSH6, MUTYH, NBN, NF1, NF2, NTHL1, PALB2, PDGFRA, PHOX2B, PMS2, POLD1, POLE, POT1, PRKAR1A, PTCH1, PTEN, RAD50, RAD51C, RAD51D, RB1, RECQL4, RET, RUNX1, SDHAF2, SDHA (sequence changes only), SDHB, SDHC, SDHD, SMAD4, SMARCA4, SMARCB1, SMARCE1, STK11, SUFU, TERT, TERT, TMEM127, TP53, TSC1, TSC2, VHL, WRN and WT1.    Based on Wendy [name]'s family history of cancer, she meets medical criteria for genetic  testing. Despite that she meets criteria, she may still have an out of pocket cost. We discussed that if her out of pocket cost for testing is over $100, the laboratory will call and confirm whether she wants to proceed with testing.  If the out of pocket cost of testing is less than $100 she will be billed by the genetic testing laboratory.   PLAN: After considering the risks, benefits, and limitations, Wendy [name]  provided informed consent to pursue genetic testing and the blood sample was sent to [facility] for analysis of the Multi-gene panel. Results should be available within approximately 2-3 weeks' time, at which point they will be disclosed by telephone to Wendy [name], as will any additional recommendations warranted by these results. Wendy [name] will receive a summary of her genetic counseling visit and a copy of her results once available. This information will also be available in Epic. We encouraged Wendy [name] to remain in contact with cancer genetics annually so that we can continuously update the  family history and inform her of any changes in cancer genetics and testing that may be of benefit for her family. Wendy [name] questions were answered to her satisfaction today. Our contact information was provided should additional questions or concerns arise.  Lastly, we encouraged Wendy [name] to remain in contact with cancer genetics annually so that we can continuously update the family history and inform her of any changes in cancer genetics and testing that may be of benefit for this family.   Ms.  [name] questions were answered to her satisfaction today. Our contact information was provided should additional questions or concerns arise. Thank you for the referral and allowing [place] to share in the care of your patient.   Karen P. Florene Glen, [place], [facility] Certified Genetic Counselor [email or web address] phone: [phone]  The patient was seen for a total of 60 minutes in face-to-face genetic counseling.  This patient was discussed with Drs. Magrinat, Lindi Adie and/or Burr Medico who agrees with the above.    _______________________________________________________________________ For Office Staff:  Number of people involved in session: 1 Was an Intern/ student involved with case: no

## 2017-02-11 ENCOUNTER — Encounter: Payer: Self-pay | Admitting: Genetic Counselor

## 2017-02-11 ENCOUNTER — Telehealth: Payer: Self-pay | Admitting: Genetic Counselor

## 2017-02-11 DIAGNOSIS — Z1379 Encounter for other screening for genetic and chromosomal anomalies: Secondary | ICD-10-CM | POA: Insufficient documentation

## 2017-02-11 NOTE — Telephone Encounter (Signed)
Revealed negative genetic testing.  Discussed that we do not know why there is cancer in the family. It could be due to a different gene that we are not testing, or maybe our current technology may not be able to pick something up.  It will be important for her to keep in contact with genetics to keep up with whether additional testing may be needed.  

## 2017-02-15 ENCOUNTER — Ambulatory Visit: Payer: Self-pay | Admitting: Genetic Counselor

## 2017-02-15 DIAGNOSIS — Z8041 Family history of malignant neoplasm of ovary: Secondary | ICD-10-CM

## 2017-02-15 DIAGNOSIS — D249 Benign neoplasm of unspecified breast: Secondary | ICD-10-CM

## 2017-02-15 DIAGNOSIS — Z803 Family history of malignant neoplasm of breast: Secondary | ICD-10-CM

## 2017-02-15 DIAGNOSIS — Z8051 Family history of malignant neoplasm of kidney: Secondary | ICD-10-CM

## 2017-02-15 DIAGNOSIS — Z1379 Encounter for other screening for genetic and chromosomal anomalies: Secondary | ICD-10-CM

## 2017-02-15 NOTE — Progress Notes (Signed)
HPI: Ms. Schaper was previously seen in the [facility] clinic due to a family history of cancer and concerns regarding a hereditary predisposition to cancer. Please refer to our prior cancer genetics clinic note for more information regarding Ms. Blatchford's medical, social and family histories, and our assessment and recommendations, at the time. Ms. Scrivens recent genetic test results were disclosed to her, as were recommendations warranted by these results. These results and recommendations are discussed in more detail below.  CANCER HISTORY:   No history exists.    FAMILY HISTORY:  We obtained a detailed, 4-generation family history.  Significant diagnoses are listed below: Family History  Problem Relation Age of Onset  . Cancer Mother        kidney  . Cancer Maternal Aunt        breast, lung  . Lung cancer Maternal Aunt   . Cancer Maternal Uncle        lung, stomach  . Cancer Maternal Grandmother        ovarian  . Prostate cancer Paternal Uncle   . Pancreatic cancer Maternal Grandfather   . Lung cancer Maternal Uncle   . Breast cancer Maternal Aunt        dx in her 7s  . Prostate cancer Maternal Uncle   . Ovarian cancer Cousin        dx in her 87s; maternal first cousin    The patient has a son and daughter who are cancer free.  She has two sisters and a paternal half brother who are cancer free.  Her mother was diagnosed with kidney cancer at age 71.  She had 11 siblings.  A sister had breast cancer in her 28's, a brother and sister both had lung cancer, a brother had prostate cancer and another brother had stomach cancer.  The sister with lung cancer had a daughter who has ovarian cancer in her 53's.  The patient's maternal grandmother had ovarian cancer and her grandfather had pancreatic cancer.  The patient's father had a brother and two sisters.  His brother has prostate cancer.  The patient's paternal grandparents are deceased.  Ms. Janeway is unaware  of previous family history of genetic testing for hereditary cancer risks. Patient's maternal ancestors are of [place] and [place] descent, and paternal ancestors are of [place] American and Caucasian descent. There is no reported Ashkenazi Jewish ancestry. There is no known consanguinity.  GENETIC TEST RESULTS: Genetic testing reported out on February 09, 2017 through the Multi-gene cancer panel found no deleterious mutations.  The Multi-Gene Panel offered by [facility] includes sequencing and/or deletion duplication testing of the following 80 genes: ALK, APC, ATM, AXIN2,BAP1,  BARD1, BLM, BMPR1A, BRCA1, BRCA2, BRIP1, CASR, CDC73, CDH1, CDK4, CDKN1B, CDKN1C, CDKN2A (p14ARF), CDKN2A (p16INK4a), CEBPA, CHEK2, CTNNA1, DICER1, DIS3L2, EGFR (c.2369C>T, p.Thr790Met variant only), EPCAM (Deletion/duplication testing only), FH, FLCN, GATA2, GPC3, GREM1 (Promoter region deletion/duplication testing only), HOXB13 (c.251G>A, p.Gly84Glu), HRAS, KIT, MAX, MEN1, MET, MITF (c.952G>A, p.Glu318Lys variant only), MLH1, MSH2, MSH3, MSH6, MUTYH, NBN, NF1, NF2, NTHL1, PALB2, PDGFRA, PHOX2B, PMS2, POLD1, POLE, POT1, PRKAR1A, PTCH1, PTEN, RAD50, RAD51C, RAD51D, RB1, RECQL4, RET, RUNX1, SDHAF2, SDHA (sequence changes only), SDHB, SDHC, SDHD, SMAD4, SMARCA4, SMARCB1, SMARCE1, STK11, SUFU, TERT, TERT, TMEM127, TP53, TSC1, TSC2, VHL, WRN and WT1.  The test report has been scanned into EPIC and is located under the Molecular Pathology section of the Results Review tab.   We discussed with Ms. Tarry that since the current genetic testing is  not perfect, it is possible there may be a gene mutation in one of these genes that current testing cannot detect, but that chance is small. We also discussed, that it is possible that another gene that has not yet been discovered, or that we have not yet tested, is responsible for the cancer diagnoses in the family, and it is, therefore, important to remain in touch with cancer genetics  in the future so that we can continue to offer Ms. Ron the most up to date genetic testing.   Genetic testing did detect a Variant of Unknown Significance in the ALK gene called c.1918G>A (p.Gly640Arg).  At this time, it is unknown if this variant is associated with increased cancer risk or if this is a normal finding, but most variants such as this get reclassified to being inconsequential. It should not be used to make medical management decisions. With time, we suspect the lab will determine the significance of this variant, if any. If we do learn more about it, we will try to contact Ms. Odonnel to discuss it further. However, it is important to stay in touch with [place] periodically and keep the address and phone number up to date.    CANCER SCREENING RECOMMENDATIONS: This normal result is reassuring and indicates that Ms. Raymond does not likely have an increased risk of cancer due to a mutation in one of these genes.  We, therefore, recommended  Ms. Gerety continue to follow the cancer screening guidelines provided by her primary healthcare providers.   RECOMMENDATIONS FOR FAMILY MEMBERS: Women in this family might be at some increased risk of developing cancer, over the general population risk, simply due to the family history of cancer. We recommended women in this family have a yearly mammogram beginning at age 17, or 90 years younger than the earliest onset of cancer, an annual clinical breast exam, and perform monthly breast self-exams. Women in this family should also have a gynecological exam as recommended by their primary provider. All family members should have a colonoscopy by age 24.  Based on Ms. Landa's family history, we recommended her maternal cousin, who was diagnosed with ovarian cancer at age 88, have genetic counseling and testing. Ms. Lightsey will let us know if we can be of any assistance in coordinating genetic counseling and/or testing for this family member.    FOLLOW-UP: Lastly, we discussed with Ms. Verdell that cancer genetics is a rapidly advancing field and it is possible that new genetic tests will be appropriate for her and/or her family members in the future. We encouraged her to remain in contact with cancer genetics on an annual basis so we can update her personal and family histories and let her know of advances in cancer genetics that may benefit this family.   Our contact number was provided. Ms. Enloe questions were answered to her satisfaction, and she knows she is welcome to call us at anytime with additional questions or concerns.   [name], MS, [facility] Certified Genetic Counselor [email or web address]

## 2017-06-22 ENCOUNTER — Emergency Department (HOSPITAL_COMMUNITY)
Admission: EM | Admit: 2017-06-22 | Discharge: 2017-06-23 | Disposition: A | Discharge status: Home / Self Care | Payer: BLUE CROSS/BLUE SHIELD | Attending: Emergency Medicine | Admitting: Emergency Medicine

## 2017-06-22 ENCOUNTER — Encounter (HOSPITAL_COMMUNITY): Payer: Self-pay | Admitting: Emergency Medicine

## 2017-06-22 DIAGNOSIS — W19XXXA Unspecified fall, initial encounter: Secondary | ICD-10-CM

## 2017-06-22 DIAGNOSIS — Z23 Encounter for immunization: Secondary | ICD-10-CM | POA: Diagnosis not present

## 2017-06-22 DIAGNOSIS — S0181XA Laceration without foreign body of other part of head, initial encounter: Secondary | ICD-10-CM | POA: Diagnosis not present

## 2017-06-22 DIAGNOSIS — S0990XA Unspecified injury of head, initial encounter: Secondary | ICD-10-CM | POA: Diagnosis present

## 2017-06-22 DIAGNOSIS — Y92009 Unspecified place in unspecified non-institutional (private) residence as the place of occurrence of the external cause: Secondary | ICD-10-CM | POA: Insufficient documentation

## 2017-06-22 DIAGNOSIS — W01198A Fall on same level from slipping, tripping and stumbling with subsequent striking against other object, initial encounter: Secondary | ICD-10-CM | POA: Insufficient documentation

## 2017-06-22 DIAGNOSIS — Y9389 Activity, other specified: Secondary | ICD-10-CM | POA: Diagnosis not present

## 2017-06-22 DIAGNOSIS — Z79899 Other long term (current) drug therapy: Secondary | ICD-10-CM | POA: Insufficient documentation

## 2017-06-22 DIAGNOSIS — S0511XA Contusion of eyeball and orbital tissues, right eye, initial encounter: Secondary | ICD-10-CM

## 2017-06-22 DIAGNOSIS — S0083XA Contusion of other part of head, initial encounter: Secondary | ICD-10-CM | POA: Diagnosis not present

## 2017-06-22 DIAGNOSIS — Y998 Other external cause status: Secondary | ICD-10-CM | POA: Diagnosis not present

## 2017-06-22 NOTE — ED Triage Notes (Signed)
Pt reports that she fell going up the steps and hit head on rail. 2cm laceration noted to forehead. No active bleeding at this time. Pt denies any LOC.

## 2017-06-23 MED ORDER — FLUORESCEIN SODIUM 1 MG OP STRP
1.0000 | ORAL_STRIP | Freq: Once | OPHTHALMIC | Status: AC
  Administered 2017-06-23: 1 via OPHTHALMIC
  Filled 2017-06-23: qty 1

## 2017-06-23 MED ORDER — ERYTHROMYCIN 5 MG/GM OP OINT: 1.0000 "application " | TOPICAL_OINTMENT | Freq: Four times a day (QID) | OPHTHALMIC | 0 refills | Status: DC

## 2017-06-23 MED ORDER — ACETAMINOPHEN 500 MG PO TABS
1000.0000 mg | ORAL_TABLET | Freq: Once | ORAL | Status: AC
  Administered 2017-06-23: 1000 mg via ORAL
  Filled 2017-06-23: qty 2

## 2017-06-23 MED ORDER — TETANUS-DIPHTH-ACELL PERTUSSIS 5-2.5-18.5 LF-MCG/0.5 IM SUSP
0.5000 mL | Freq: Once | INTRAMUSCULAR | Status: AC
  Administered 2017-06-23: 0.5 mL via INTRAMUSCULAR
  Filled 2017-06-23: qty 0.5

## 2017-06-23 MED ORDER — LIDOCAINE-EPINEPHRINE (PF) 2 %-1:200000 IJ SOLN
10.0000 mL | Freq: Once | INTRAMUSCULAR | Status: AC
  Administered 2017-06-23: 10 mL
  Filled 2017-06-23: qty 20

## 2017-06-23 NOTE — Discharge Instructions (Signed)
We recommend Tylenol or ibuprofen for headache.  Apply erythromycin ointment to your right eye and follow-up with your primary care doctor if foreign body sensation persists past 48 hours.  Your stitches do not require removal.  We advise application of topical Neosporin.  You may use vitamin E oil after 48-72 hours to the area to try and prevent scarring.  Return to the emergency department, as needed, for new or concerning symptoms.

## 2017-06-23 NOTE — ED Provider Notes (Signed)
[facility] DEPT Provider Note   CSN: [id number] Arrival date & time: 06/22/17  2151     History   Chief Complaint Chief Complaint  Patient presents with  . Laceration    HPI Wendy [name] is a 46 y.o. female.  46 year old female presents to the emergency department after a fall at home.  She was going up the steps to her house when she tripped, striking her head on the rail at ~2000.  She denies any loss of consciousness.  She does have a headache currently.  She states that she felt "woozy".  She has had mild photophobia.  No vomiting, extremity numbness or weakness.  She cannot recall the date of her last tetanus shot.  No medications taken prior to arrival for symptoms.      Past Medical History:  Diagnosis Date  . Arthritis    feet  . Degenerative disc disease    lumbar  . Dysphagia   . Family history of breast cancer   . Family history of kidney cancer   . Family history of ovarian cancer   . GERD (gastroesophageal reflux disease)   . History of MRSA infection    leg  . Irritable bowel syndrome (IBS)   . 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 Genetic testing 02/11/2017  . Family history of breast cancer   . Family history of ovarian cancer   . Family history of kidney cancer   . Atypical chest pain 01/01/2017  . Breast lump on left side at 3 o'clock position 07/25/2012  . 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 BREAST BIOPSY  08/22/2011   Procedure: BREAST BIOPSY;  Surgeon: Harl Bowie, MD;  Location: [place];  Service: General;  Laterality: Left;  excision left breast mass  . BREAST LUMPECTOMY WITH RADIOACTIVE SEED LOCALIZATION Left 11/30/2016   Procedure: BREAST LUMPECTOMY WITH RADIOACTIVE SEED LOCALIZATION;  Surgeon: Fanny Skates, MD;  Location: [place];  Service: General;  Laterality: Left;  .  BUNIONECTOMY Bilateral   . DIAGNOSTIC LAPAROSCOPY  11/22/2008   biopsy of bilateral uterosacral ligaments and lysis of adhesions  . DILATION AND CURETTAGE OF UTERUS  07/06/2004  . HYSTEROSCOPY WITH NOVASURE  07/06/2004  . TONSILLECTOMY AND ADENOIDECTOMY    . TUBAL LIGATION      OB History    Gravida Para Term Preterm AB Living   3 2 2   1 2    SAB TAB Ectopic Multiple Live Births   1               Home Medications    Prior to Admission medications   Medication Sig Start Date End Date Taking? Authorizing Provider  Naproxen Sod-Diphenhydramine (ALEVE PM) 220-25 MG TABS Take 2 tablets by mouth at bedtime as needed. Sleep and pain   Yes [provider]  omeprazole (PRILOSEC) 40 MG capsule Take 40 mg by mouth daily.   Yes [provider]  erythromycin ophthalmic ointment Place 1 application into the right eye every 6 (six) hours. Place 1/2 inch ribbon of ointment in the affected eye 4 times a day 06/23/17   Antonietta Breach, PA-C    Family History Family History  Problem Relation Age of Onset  . Cancer Mother        kidney  . Cancer Maternal Aunt  breast, lung  . Lung cancer Maternal Aunt   . Cancer Maternal Uncle        lung, stomach  . Cancer Maternal Grandmother        ovarian  . Prostate cancer Paternal Uncle   . Pancreatic cancer Maternal Grandfather   . Lung cancer Maternal Uncle   . Breast cancer Maternal Aunt        dx in her 5s  . Prostate cancer Maternal Uncle   . Ovarian cancer Cousin        dx in her 51s; maternal first cousin    Social History Social History  Substance Use Topics  . Smoking status: Never Smoker  . Smokeless tobacco: Never Used  . Alcohol use Yes     Comment: occasionally     Allergies   Morphine and related   Review of Systems Review of Systems Ten systems reviewed and are negative for acute change, except as noted in the HPI.    Physical Exam Updated Vital Signs BP 128/90 (BP Location: Left Arm)    Pulse 91   Temp 98.1 F (36.7 C) (Oral)   Resp 18   Ht 5\' 5"  (1.651 m)   Wt 72.6 kg (160 lb)   SpO2 100%   BMI 26.63 kg/m   Physical Exam  Constitutional: She is oriented to person, place, and time. She appears well-developed and well-nourished. No distress.  Nontoxic appearing and in no acute distress  HENT:  Head: Normocephalic.  Laceration to the central forehead; jagged.  Mild underlying hematoma.  No skull instability.  Eyes: Pupils are equal, round, and reactive to light. Conjunctivae and EOM are normal. No scleral icterus.  Mild blepharitis and ecchymosis to the right upper eyelid.  Normal EOMs. PERRL.  No subconjunctival hemorrhage.  No foreign body visualized.  No uptake on fluorescein staining.  Negative Seidel sign.  Neck: Normal range of motion.  No meningismus  Pulmonary/Chest: Effort normal. No respiratory distress.  Respirations even and unlabored  Musculoskeletal: Normal range of motion.  Neurological: She is alert and oriented to person, place, and time. No cranial nerve deficit. She exhibits normal muscle tone. Coordination normal.  GCS 15. Speech is goal oriented. No focal neurologic deficits appreciated.  Skin: Skin is warm and dry. No rash noted. She is not diaphoretic. No erythema. No pallor.  Psychiatric: She has a normal mood and affect. Her behavior is normal.  Nursing note and vitals reviewed.    ED Treatments / Results  Labs (all labs ordered are listed, but only abnormal results are displayed) Labs Reviewed - No data to display  EKG  EKG Interpretation None       Radiology No results found.  Procedures Procedures (including critical care time)  LACERATION REPAIR Performed by: Antonietta Breach Authorized by: Antonietta Breach Consent: Verbal consent obtained. Risks and benefits: risks, benefits and alternatives were discussed Consent given by: patient Patient identity confirmed: provided demographic data Prepped and Draped in normal sterile  fashion Wound explored  Laceration Location: forehead  Laceration Length: 2cm  No Foreign Bodies seen or palpated  Anesthesia: local infiltration  Local anesthetic: lidocaine 2% with epinephrine  Anesthetic total: 2 ml  Irrigation method: syringe Amount of cleaning: standard  Skin closure: 5-0 vicryl  Number of sutures: 3  Technique: simple interrupted  Patient tolerance: Patient tolerated the procedure well with no immediate complications.   Medications Ordered in ED Medications  lidocaine-EPINEPHrine (XYLOCAINE W/EPI) 2 %-1:200000 (PF) injection 10 mL (not administered)  Tdap (  BOOSTRIX) injection 0.5 mL (0.5 mLs Intramuscular Given 06/23/17 0033)  fluorescein ophthalmic strip 1 strip (1 strip Both Eyes Given by Other 06/23/17 0035)  acetaminophen (TYLENOL) tablet 1,000 mg (1,000 mg Oral Given 06/23/17 0033)     Initial Impression / Assessment and Plan / ED Course  I have reviewed the triage vital signs and the nursing notes.  Pertinent labs & imaging results that were available during my care of the patient were reviewed by me and considered in my medical decision making (see chart for details).     46 year old female presents to the emergency department after a fall.  She had no loss of consciousness, no vomiting.  She has a reassuring neurologic exam.  No focal deficits.  She reports feeling "woozy" and is currently experiencing headache with mild photophobia.  Tylenol ordered.  Have discussed inability to rule out mild concussion, though I have low suspicion for skull fracture or intracranial hemorrhage.  I do not believe CT scan is indicated at this time.  Laceration repaired in the emergency department.  Patient tolerated well.  Patient also reporting foreign body sensation in her right eye.  She has ecchymosis and right upper lid blepharitis consistent with contusion from fall.  Patient with normal EOMs.  She denies blurry vision.  She has a negative Seidel sign and  fluorescein staining.  No corneal abrasion or visible foreign body.  Plan to increase lubrication with erythromycin ointment.  This will also prevent infection should a small corneal abrasion have been overlooked.  Have discussed eye regeneration timeline with instruction to return for any acute changes in vision. Return precautions discussed and provided. Patient discharged in stable condition with no unaddressed concerns.   Final Clinical Impressions(s) / ED Diagnoses   Final diagnoses:  Forehead laceration, initial encounter  Forehead contusion, initial encounter  Contusion of right eye, initial encounter  Fall, initial encounter    New Prescriptions New Prescriptions   ERYTHROMYCIN OPHTHALMIC OINTMENT    Place 1 application into the right eye every 6 (six) hours. Place 1/2 inch ribbon of ointment in the affected eye 4 times a day     Antonietta Breach, PA-C 06/23/17 1610    Drenda Freeze, MD 06/23/17 [phone]

## 2018-03-13 ENCOUNTER — Encounter: Payer: Self-pay | Admitting: Genetic Counselor

## 2018-06-02 ENCOUNTER — Encounter (HOSPITAL_COMMUNITY): Payer: Self-pay | Admitting: Family Medicine

## 2018-06-02 ENCOUNTER — Ambulatory Visit (HOSPITAL_COMMUNITY)
Admission: EM | Admit: 2018-06-02 | Discharge: 2018-06-02 | Disposition: A | Discharge status: Home / Self Care | Payer: BLUE CROSS/BLUE SHIELD | Attending: Family Medicine | Admitting: Family Medicine

## 2018-06-02 DIAGNOSIS — R350 Frequency of micturition: Secondary | ICD-10-CM | POA: Diagnosis not present

## 2018-06-02 DIAGNOSIS — N390 Urinary tract infection, site not specified: Secondary | ICD-10-CM | POA: Diagnosis present

## 2018-06-02 DIAGNOSIS — Z885 Allergy status to narcotic agent status: Secondary | ICD-10-CM | POA: Insufficient documentation

## 2018-06-02 DIAGNOSIS — M545 Low back pain: Secondary | ICD-10-CM | POA: Insufficient documentation

## 2018-06-02 DIAGNOSIS — Z79899 Other long term (current) drug therapy: Secondary | ICD-10-CM | POA: Insufficient documentation

## 2018-06-02 LAB — POCT URINALYSIS DIP (DEVICE)
Bilirubin Urine: NEGATIVE
Glucose, UA: NEGATIVE mg/dL
Ketones, ur: NEGATIVE mg/dL
Leukocytes, UA: NEGATIVE
Nitrite: NEGATIVE
PROTEIN: NEGATIVE mg/dL
Specific Gravity, Urine: 1.01 (ref 1.005–1.030)
UROBILINOGEN UA: 0.2 mg/dL (ref 0.0–1.0)
pH: 5.5 (ref 5.0–8.0)

## 2018-06-02 MED ORDER — NITROFURANTOIN MONOHYD MACRO 100 MG PO CAPS: 100.0000 mg | ORAL_CAPSULE | Freq: Two times a day (BID) | ORAL | 0 refills | Status: DC

## 2018-06-02 NOTE — ED Triage Notes (Signed)
Pt here for UTI sx with frequent urination

## 2018-06-02 NOTE — Discharge Instructions (Addendum)
If you are not feeling better in the next couple days, make an appointment with [facility].

## 2018-06-02 NOTE — ED Provider Notes (Signed)
[name]    CSN: [id number] Arrival date & time: 06/02/18  1303     History   Chief Complaint Chief Complaint  Patient presents with  . Urinary Tract Infection    HPI Wendy [name] is a 47 y.o. female.   Is a 47 year old woman who has been going to the bathroom frequently without dysuria for about 6 weeks.  She finds she gets up about 3 times a night.  It is associated with some low back pain as well.  She said no fever and no blood in urine.  Patient has no history of recurrent urinary infections.  Patient works in an office.     Past Medical History:  Diagnosis Date  . Arthritis    feet  . Degenerative disc disease    lumbar  . Dysphagia   . Family history of breast cancer   . Family history of kidney cancer   . Family history of ovarian cancer   . GERD (gastroesophageal reflux disease)   . History of MRSA infection    leg  . Irritable bowel syndrome (IBS)   . 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 Genetic testing 02/11/2017  . Family history of breast cancer   . Family history of ovarian cancer   . Family history of kidney cancer   . Atypical chest pain 01/01/2017  . Breast lump on left side at 3 o'clock position 07/25/2012  . 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 BREAST BIOPSY  08/22/2011   Procedure: BREAST BIOPSY;  Surgeon: Harl Bowie, MD;  Location: [facility];  Service: General;  Laterality: Left;  excision left breast mass  . BREAST LUMPECTOMY WITH RADIOACTIVE SEED LOCALIZATION Left 11/30/2016   Procedure: BREAST LUMPECTOMY WITH RADIOACTIVE SEED LOCALIZATION;  Surgeon: Fanny Skates, MD;  Location: [facility];  Service: General;  Laterality: Left;  . BUNIONECTOMY Bilateral   . DIAGNOSTIC LAPAROSCOPY  11/22/2008   biopsy of bilateral uterosacral ligaments and lysis of adhesions  . DILATION AND CURETTAGE OF  UTERUS  07/06/2004  . HYSTEROSCOPY WITH NOVASURE  07/06/2004  . TONSILLECTOMY AND ADENOIDECTOMY    . TUBAL LIGATION      OB History    Gravida  3   Para  2   Term  2   Preterm      AB  1   Living  2     SAB  1   TAB      Ectopic      Multiple      Live Births               Home Medications    Prior to Admission medications   Medication Sig Start Date End Date Taking? Authorizing Provider  nitrofurantoin, macrocrystal-monohydrate, (MACROBID) 100 MG capsule Take 1 capsule (100 mg total) by mouth 2 (two) times daily. 06/02/18   Robyn Haber, MD  omeprazole (PRILOSEC) 40 MG capsule Take 40 mg by mouth daily.    [provider]  doxazosin (CARDURA) 1 MG tablet Take 1 mg by mouth at bedtime.  11/14/15  [provider]    Family History Family History  Problem Relation Age of Onset  . Cancer Mother        kidney  . Cancer Maternal Aunt        breast, lung  . Lung cancer Maternal  Aunt   . Cancer Maternal Uncle        lung, stomach  . Cancer Maternal Grandmother        ovarian  . Prostate cancer Paternal Uncle   . Pancreatic cancer Maternal Grandfather   . Lung cancer Maternal Uncle   . Breast cancer Maternal Aunt        dx in her 67s  . Prostate cancer Maternal Uncle   . Ovarian cancer Cousin        dx in her 14s; maternal first cousin    Social History Social History   Tobacco Use  . Smoking status: Never Smoker  . Smokeless tobacco: Never Used  Substance Use Topics  . Alcohol use: Yes    Comment: occasionally  . Drug use: No     Allergies   Morphine and related   Review of Systems Review of Systems  Gastrointestinal: Negative.   Genitourinary: Positive for frequency and urgency. Negative for dysuria.  Musculoskeletal: Positive for back pain.  All other systems reviewed and are negative.    Physical Exam Triage Vital Signs ED Triage Vitals  Enc Vitals Group     BP 06/02/18 1342 (!) 152/91     Pulse Rate  06/02/18 1342 68     Resp 06/02/18 1342 16     Temp 06/02/18 1342 98.2 F (36.8 C)     Temp Source 06/02/18 1342 Oral     SpO2 06/02/18 1342 100 %     Weight --      Height --      Head Circumference --      Peak Flow --      Pain Score 06/02/18 1351 5     Pain Loc --      Pain Edu? --      Excl. in [place]? --    No data found.  Updated Vital Signs BP (!) 152/91 (BP Location: Left Arm)   Pulse 68   Temp 98.2 F (36.8 C) (Oral)   Resp 16   SpO2 100%    Physical Exam  Constitutional: She is oriented to person, place, and time. She appears well-developed and well-nourished.  HENT:  Head: Normocephalic.  Right Ear: External ear normal.  Left Ear: External ear normal.  Mouth/Throat: Oropharynx is clear and moist.  Eyes: Pupils are equal, round, and reactive to light. Conjunctivae are normal.  Neck: Normal range of motion. Neck supple.  Pulmonary/Chest: Effort normal.  Musculoskeletal: Normal range of motion.  Neurological: She is alert and oriented to person, place, and time.  Skin: Skin is warm and dry.  Psychiatric: She has a normal mood and affect. Her behavior is normal.  Nursing note and vitals reviewed.    UC Treatments / Results  Labs (all labs ordered are listed, but only abnormal results are displayed) Labs Reviewed  POCT URINALYSIS DIP (DEVICE) - Abnormal; Notable for the following components:      Result Value   Hgb urine dipstick TRACE (*)    All other components within normal limits  URINE CULTURE    EKG None  Radiology No results found.  Procedures Procedures (including critical care time)  Medications Ordered in UC Medications - No data to display  Initial Impression / Assessment and Plan / UC Course  I have reviewed the triage vital signs and the nursing notes.  Pertinent labs & imaging results that were available during my care of the patient were reviewed by me and considered in my medical decision  making (see chart for details).      Final Clinical Impressions(s) / UC Diagnoses   Final diagnoses:  Urinary frequency     Discharge Instructions     If you are not feeling better in the next couple days, make an appointment with [facility].    ED Prescriptions    Medication Sig Dispense Auth. Provider   nitrofurantoin, macrocrystal-monohydrate, (MACROBID) 100 MG capsule Take 1 capsule (100 mg total) by mouth 2 (two) times daily. 20 capsule Robyn Haber, MD     Controlled Substance Prescriptions [place] Controlled Substance Registry consulted? Not Applicable   Robyn Haber, MD 06/02/18 1409

## 2018-06-03 LAB — URINE CULTURE: Culture: NO GROWTH

## 2018-07-07 ENCOUNTER — Ambulatory Visit (INDEPENDENT_AMBULATORY_CARE_PROVIDER_SITE_OTHER): Payer: Self-pay | Admitting: Orthopedic Surgery

## 2018-07-14 ENCOUNTER — Ambulatory Visit (INDEPENDENT_AMBULATORY_CARE_PROVIDER_SITE_OTHER): Payer: BLUE CROSS/BLUE SHIELD | Admitting: Orthopedic Surgery

## 2018-10-02 IMAGING — CR DG CHEST 2V
2 series · 2 of 2 positions shown · non-contrast
Comparison: None.

CLINICAL DATA: Chest pain on and off for weeks

EXAM:
CHEST  2 VIEW

[chest pa]
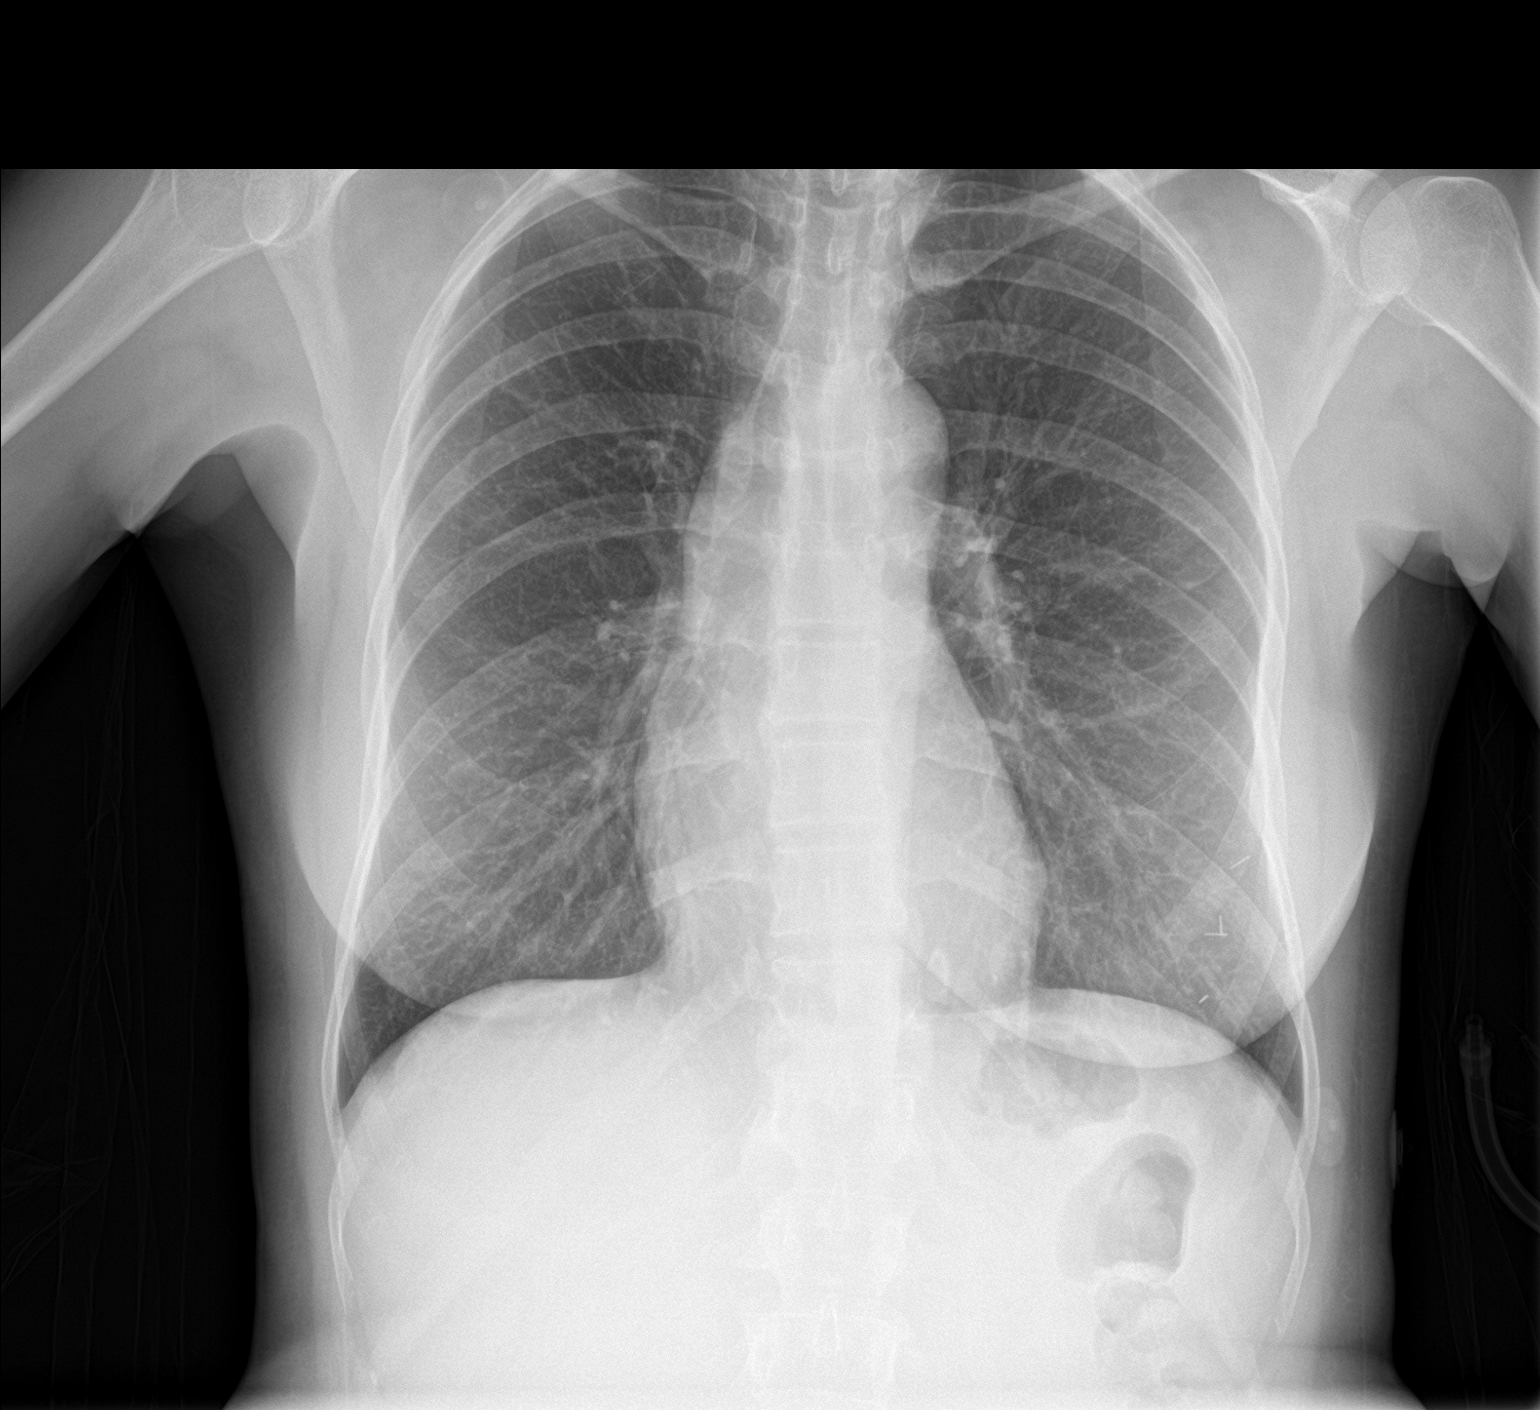

[chest lat]
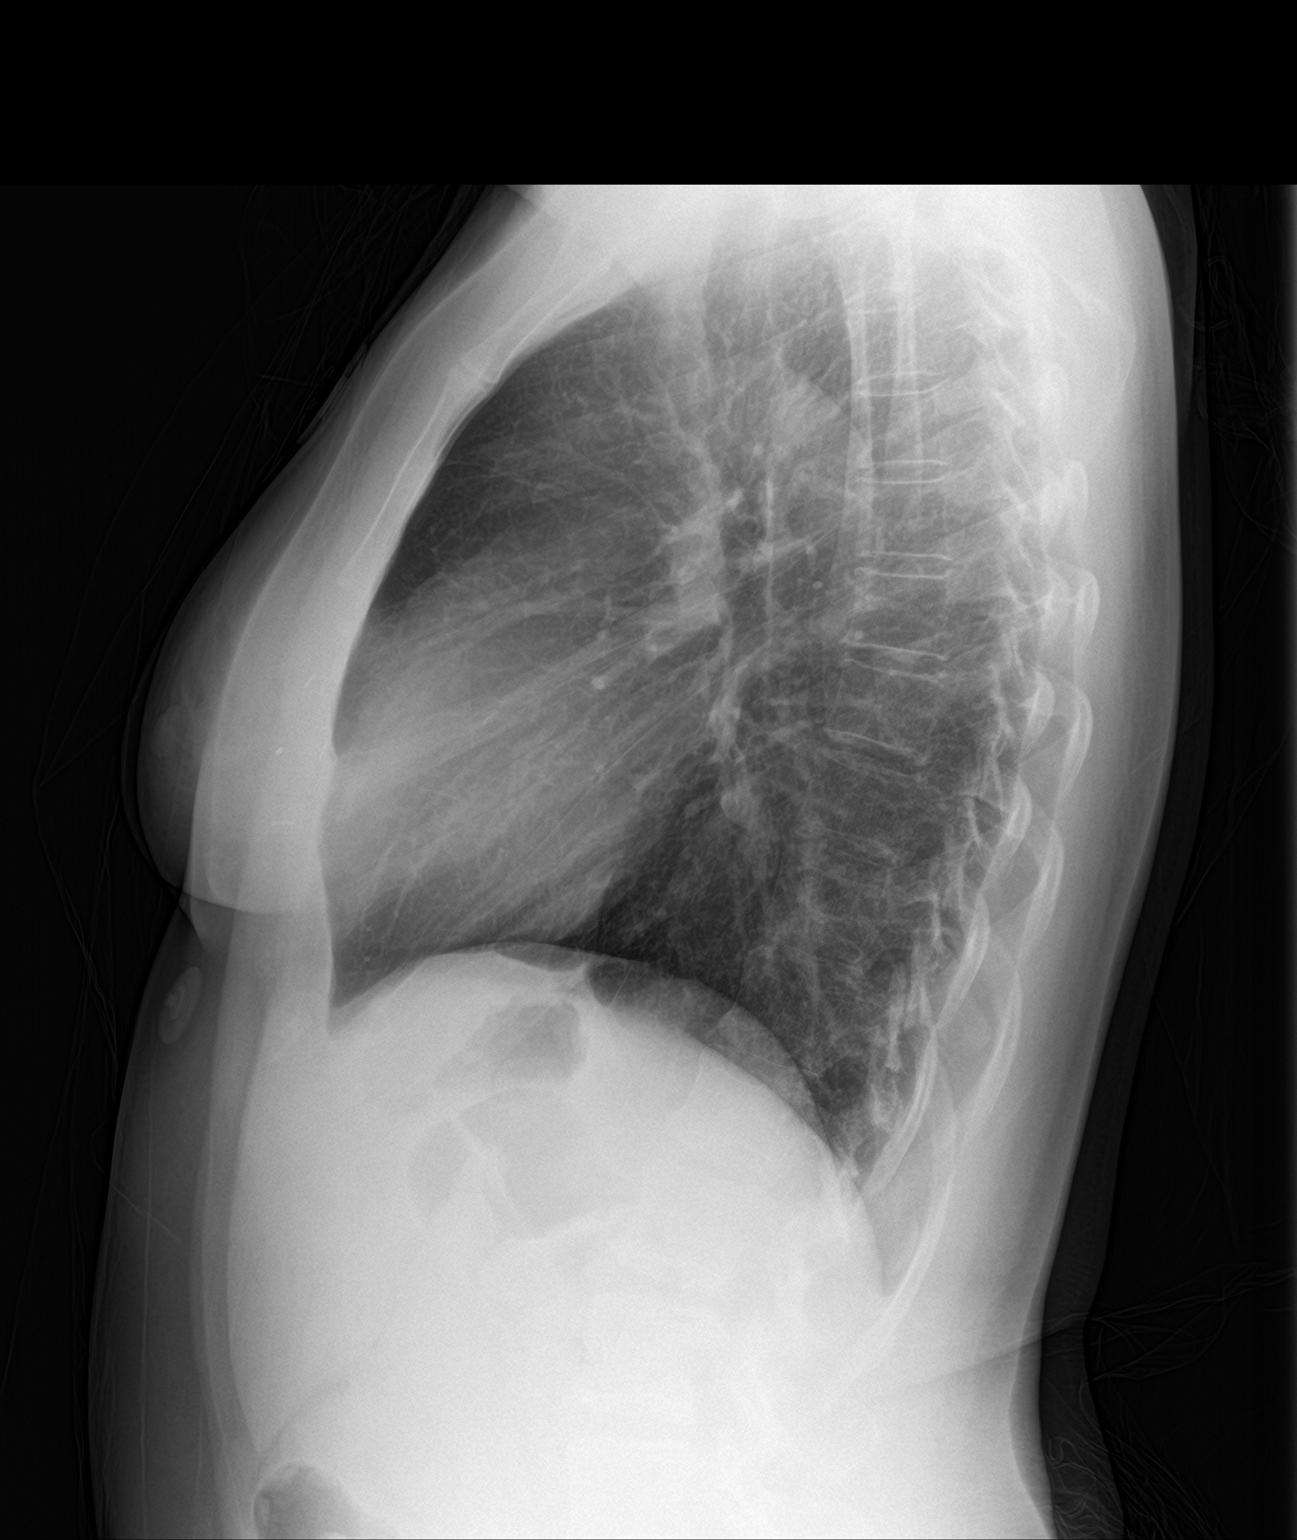

[2 of 2 positions shown; findings below may reference images not displayed]

FINDINGS: Normal heart size and mediastinal contours. No acute infiltrate or
edema. No effusion or pneumothorax. Postoperative left breast. No
acute osseous findings.
IMPRESSION: Negative chest.

## 2024-09-17 ENCOUNTER — Ambulatory Visit: Admitting: Nurse Practitioner

## 2024-09-17 ENCOUNTER — Encounter: Payer: Self-pay | Admitting: Nurse Practitioner

## 2024-09-17 VITALS — BP 130/78 | HR 80 | Temp 97.7°F

## 2024-09-17 DIAGNOSIS — Z789 Other specified health status: Secondary | ICD-10-CM

## 2024-09-17 NOTE — Progress Notes (Signed)
 [facility]  Subjective:  Patient ID: Wendy [name], female    DOB: 1970/12/31  Age: 54 y.o. MRN: [id number]  CC: Wellness Exam   HPI Wendy [name] presents for wellness exam visit for insurance benefit.  Patient has a PCP: no PCP, moved to this area a few months ago. Would like to establish with one.  PMH significant for: hx of papilloma of left breast, recently had mammogram that was abnormal requiring further follow-up.  Last labs per PCP were completed: within the last year.  Health Maintenance:  Colonoscopy: up to date, repeat in 2033 per patient  Mammogram: Jan 2026, abnormal, currently working with [facility].  Pap: unsure.    Smoker: never  Immunizations:  Shingrix-  never done, but interested in getting this.  Flu- declines yearly Tdap: up to date.   Lifestyle: Diet- no particular diet.  Exercise- works out 2-3 times a week.     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    Past Surgical History:  Procedure Laterality Date   BREAST BIOPSY  08/22/2011   Procedure: BREAST BIOPSY;  Surgeon: Vicenta DELENA Poli, MD;  Location: [facility];  Service: General;  Laterality: Left;  excision left breast mass   BREAST LUMPECTOMY WITH RADIOACTIVE SEED LOCALIZATION Left 11/30/2016   Procedure: BREAST LUMPECTOMY WITH RADIOACTIVE SEED LOCALIZATION;  Surgeon: Elon Pacini, MD;  Location: [facility];  Service: General;  Laterality: Left;   BUNIONECTOMY Bilateral    DIAGNOSTIC LAPAROSCOPY  11/22/2008   biopsy of bilateral uterosacral ligaments and lysis of adhesions   DILATION AND CURETTAGE OF UTERUS  07/06/2004    HYSTEROSCOPY WITH NOVASURE  07/06/2004   TONSILLECTOMY AND ADENOIDECTOMY     TUBAL LIGATION      Outpatient Medications Prior to Visit  Medication Sig Dispense Refill   estradiol (ESTRACE) 0.01 % CREA vaginal cream Place 1 Applicatorful vaginally daily.     progesterone (PROMETRIUM) 100 MG capsule Take 100 mg by mouth daily.     nitrofurantoin , macrocrystal-monohydrate, (MACROBID ) 100 MG capsule Take 1 capsule (100 mg total) by mouth 2 (two) times daily. 20 capsule 0   omeprazole (PRILOSEC) 40 MG capsule Take 40 mg by mouth daily.     No facility-administered medications prior to visit.    ROS Review of Systems  HENT:  Negative for ear pain and hearing loss.   Eyes:  Negative for visual disturbance.  Respiratory:  Negative for shortness of breath.   Cardiovascular:  Negative for chest pain and leg swelling.  Gastrointestinal:  Negative for diarrhea, nausea and vomiting.  Musculoskeletal:  Negative for arthralgias, back pain and neck pain.  Neurological:  Negative for dizziness and headaches.  Psychiatric/Behavioral:  Negative for sleep disturbance. The patient is nervous/anxious (alittle).     Objective:  BP 130/78 (BP Location: Right Arm, Patient Position: Sitting, Cuff Size: Normal)   Pulse 80   Temp 97.7 F (36.5 C) (Oral)   SpO2 97%   Physical Exam Constitutional:      General: She is not in acute distress. HENT:  Head: Normocephalic.     Right Ear: Tympanic membrane, ear canal and external ear normal.     Left Ear: External ear normal. There is impacted cerumen.     Mouth/Throat:     Mouth: Mucous membranes are moist.     Pharynx: No oropharyngeal exudate or posterior oropharyngeal erythema.  Eyes:     Pupils: Pupils are equal, round, and reactive to light.  Cardiovascular:     Rate and Rhythm: Normal rate and regular rhythm.     Heart sounds: Normal heart sounds.  Pulmonary:     Effort: Pulmonary effort is normal.  Abdominal:     General: Bowel sounds are  normal.     Palpations: Abdomen is soft.  Musculoskeletal:        General: Normal range of motion.  Skin:    General: Skin is warm.  Neurological:     General: No focal deficit present.     Mental Status: She is alert and oriented to person, place, and time.  Psychiatric:        Mood and Affect: Mood normal.        Behavior: Behavior normal.      Assessment & Plan:    Wendy [name] was seen today for wellness exam.  Diagnoses and all orders for this visit:  Participant in health and wellness plan Adult wellness physical was conducted today. Importance of diet and exercise were discussed in detail.  We reviewed immunizations and gave recommendations regarding current immunization needed for age. Shingles recommended, patient will let me know when she is interested in receiving this.  Preventative health exams needed: pap?  Patient was advised yearly wellness exam. New patient appt to establish care is Oct 06, 2024  Will like to return for left ear lavage. Recommended using debrox 2-3 days prior to ear cleaning.    No orders of the defined types were placed in this encounter.   No orders of the defined types were placed in this encounter.   Follow-up: next week for ear lavage.

## 2024-09-22 ENCOUNTER — Telehealth: Payer: Self-pay | Admitting: Nurse Practitioner

## 2024-10-06 ENCOUNTER — Ambulatory Visit: Payer: Self-pay | Admitting: Primary Care
